# Patient Record
Sex: Female | Born: 1960 | Race: White | Hispanic: No | Smoking: Never smoker
Health system: Southern US, Community
[De-identification: ages and names within clinical notes are randomized; demographics above are authoritative.]

## PROBLEM LIST (undated history)

## (undated) DIAGNOSIS — T8859XA Other complications of anesthesia, initial encounter: Secondary | ICD-10-CM

## (undated) DIAGNOSIS — K8020 Calculus of gallbladder without cholecystitis without obstruction: Secondary | ICD-10-CM

## (undated) DIAGNOSIS — IMO0002 Reserved for concepts with insufficient information to code with codable children: Secondary | ICD-10-CM

## (undated) DIAGNOSIS — F419 Anxiety disorder, unspecified: Secondary | ICD-10-CM

## (undated) DIAGNOSIS — Z87442 Personal history of urinary calculi: Secondary | ICD-10-CM

## (undated) DIAGNOSIS — J309 Allergic rhinitis, unspecified: Secondary | ICD-10-CM

## (undated) DIAGNOSIS — M329 Systemic lupus erythematosus, unspecified: Secondary | ICD-10-CM

## (undated) DIAGNOSIS — R519 Headache, unspecified: Secondary | ICD-10-CM

## (undated) DIAGNOSIS — F32A Depression, unspecified: Secondary | ICD-10-CM

## (undated) DIAGNOSIS — R002 Palpitations: Secondary | ICD-10-CM

## (undated) DIAGNOSIS — Z973 Presence of spectacles and contact lenses: Secondary | ICD-10-CM

## (undated) DIAGNOSIS — E079 Disorder of thyroid, unspecified: Secondary | ICD-10-CM

## (undated) DIAGNOSIS — J45909 Unspecified asthma, uncomplicated: Secondary | ICD-10-CM

## (undated) DIAGNOSIS — R51 Headache: Secondary | ICD-10-CM

## (undated) DIAGNOSIS — G8929 Other chronic pain: Secondary | ICD-10-CM

## (undated) DIAGNOSIS — K635 Polyp of colon: Secondary | ICD-10-CM

## (undated) DIAGNOSIS — N393 Stress incontinence (female) (male): Secondary | ICD-10-CM

## (undated) DIAGNOSIS — G4730 Sleep apnea, unspecified: Secondary | ICD-10-CM

## (undated) DIAGNOSIS — K589 Irritable bowel syndrome without diarrhea: Secondary | ICD-10-CM

## (undated) DIAGNOSIS — E039 Hypothyroidism, unspecified: Secondary | ICD-10-CM

## (undated) DIAGNOSIS — M797 Fibromyalgia: Secondary | ICD-10-CM

## (undated) DIAGNOSIS — F329 Major depressive disorder, single episode, unspecified: Secondary | ICD-10-CM

## (undated) DIAGNOSIS — M722 Plantar fascial fibromatosis: Secondary | ICD-10-CM

## (undated) DIAGNOSIS — E785 Hyperlipidemia, unspecified: Secondary | ICD-10-CM

## (undated) DIAGNOSIS — B977 Papillomavirus as the cause of diseases classified elsewhere: Secondary | ICD-10-CM

## (undated) HISTORY — DX: Irritable bowel syndrome, unspecified: K58.9

## (undated) HISTORY — DX: Polyp of colon: K63.5

## (undated) HISTORY — DX: Major depressive disorder, single episode, unspecified: F32.9

## (undated) HISTORY — DX: Depression, unspecified: F32.A

## (undated) HISTORY — DX: Other chronic pain: G89.29

## (undated) HISTORY — DX: Sleep apnea, unspecified: G47.30

## (undated) HISTORY — DX: Allergic rhinitis, unspecified: J30.9

## (undated) HISTORY — PX: MYRINGOTOMY: SHX2060

## (undated) HISTORY — DX: Systemic lupus erythematosus, unspecified: M32.9

## (undated) HISTORY — PX: LAPAROSCOPIC ENDOMETRIOSIS FULGURATION: SUR769

## (undated) HISTORY — DX: Reserved for concepts with insufficient information to code with codable children: IMO0002

## (undated) HISTORY — DX: Headache: R51

## (undated) HISTORY — DX: Disorder of thyroid, unspecified: E07.9

## (undated) HISTORY — DX: Fibromyalgia: M79.7

## (undated) HISTORY — DX: Headache, unspecified: R51.9

## (undated) HISTORY — DX: Hyperlipidemia, unspecified: E78.5

## (undated) HISTORY — DX: Calculus of gallbladder without cholecystitis without obstruction: K80.20

## (undated) HISTORY — PX: OTHER SURGICAL HISTORY: SHX169

## (undated) HISTORY — DX: Unspecified asthma, uncomplicated: J45.909

---

## 1997-04-16 HISTORY — PX: PARTIAL HYSTERECTOMY: SHX80

## 1997-04-16 HISTORY — PX: LAPAROSCOPIC TOTAL HYSTERECTOMY: SUR800

## 1999-09-27 ENCOUNTER — Other Ambulatory Visit: Admission: RE | Admit: 1999-09-27 | Discharge: 1999-09-27 | Payer: Self-pay | Admitting: *Deleted

## 1999-10-04 ENCOUNTER — Encounter: Admission: RE | Admit: 1999-10-04 | Discharge: 1999-10-04 | Payer: Self-pay | Admitting: Internal Medicine

## 1999-10-04 ENCOUNTER — Encounter: Payer: Self-pay | Admitting: Internal Medicine

## 2000-09-18 ENCOUNTER — Encounter: Payer: Self-pay | Admitting: Internal Medicine

## 2000-09-18 ENCOUNTER — Encounter: Admission: RE | Admit: 2000-09-18 | Discharge: 2000-09-18 | Payer: Self-pay | Admitting: Internal Medicine

## 2000-09-26 ENCOUNTER — Other Ambulatory Visit: Admission: RE | Admit: 2000-09-26 | Discharge: 2000-09-26 | Payer: Self-pay | Admitting: *Deleted

## 2001-04-10 ENCOUNTER — Ambulatory Visit (HOSPITAL_COMMUNITY): Admission: RE | Admit: 2001-04-10 | Discharge: 2001-04-10 | Payer: Self-pay | Admitting: Internal Medicine

## 2003-06-07 ENCOUNTER — Encounter: Admission: RE | Admit: 2003-06-07 | Discharge: 2003-06-07 | Payer: Self-pay

## 2004-03-16 ENCOUNTER — Ambulatory Visit (HOSPITAL_COMMUNITY): Admission: RE | Admit: 2004-03-16 | Discharge: 2004-03-16 | Payer: Self-pay | Admitting: *Deleted

## 2004-03-21 ENCOUNTER — Other Ambulatory Visit: Admission: RE | Admit: 2004-03-21 | Discharge: 2004-03-21 | Payer: Self-pay | Admitting: *Deleted

## 2004-10-11 ENCOUNTER — Encounter: Admission: RE | Admit: 2004-10-11 | Discharge: 2004-10-11 | Payer: Self-pay | Admitting: *Deleted

## 2004-11-20 ENCOUNTER — Ambulatory Visit (HOSPITAL_COMMUNITY): Admission: RE | Admit: 2004-11-20 | Discharge: 2004-11-20 | Payer: Self-pay | Admitting: *Deleted

## 2004-11-20 ENCOUNTER — Encounter (INDEPENDENT_AMBULATORY_CARE_PROVIDER_SITE_OTHER): Payer: Self-pay | Admitting: *Deleted

## 2005-05-14 ENCOUNTER — Ambulatory Visit (HOSPITAL_COMMUNITY): Admission: RE | Admit: 2005-05-14 | Discharge: 2005-05-14 | Payer: Self-pay | Admitting: *Deleted

## 2005-05-29 ENCOUNTER — Other Ambulatory Visit: Admission: RE | Admit: 2005-05-29 | Discharge: 2005-05-29 | Payer: Self-pay | Admitting: *Deleted

## 2005-07-26 ENCOUNTER — Encounter: Admission: RE | Admit: 2005-07-26 | Discharge: 2005-07-26 | Payer: Self-pay | Admitting: Otolaryngology

## 2006-08-26 ENCOUNTER — Ambulatory Visit (HOSPITAL_COMMUNITY): Admission: RE | Admit: 2006-08-26 | Discharge: 2006-08-26 | Payer: Self-pay | Admitting: *Deleted

## 2006-08-30 ENCOUNTER — Encounter: Admission: RE | Admit: 2006-08-30 | Discharge: 2006-08-30 | Payer: Self-pay | Admitting: *Deleted

## 2006-09-02 ENCOUNTER — Other Ambulatory Visit: Admission: RE | Admit: 2006-09-02 | Discharge: 2006-09-02 | Payer: Self-pay | Admitting: *Deleted

## 2007-10-22 ENCOUNTER — Ambulatory Visit (HOSPITAL_COMMUNITY): Admission: RE | Admit: 2007-10-22 | Discharge: 2007-10-22 | Payer: Self-pay | Admitting: *Deleted

## 2008-03-10 ENCOUNTER — Ambulatory Visit (HOSPITAL_COMMUNITY): Admission: RE | Admit: 2008-03-10 | Discharge: 2008-03-10 | Payer: Self-pay | Admitting: Chiropractic Medicine

## 2008-05-04 ENCOUNTER — Ambulatory Visit: Payer: Self-pay | Admitting: Pulmonary Disease

## 2008-05-04 DIAGNOSIS — R059 Cough, unspecified: Secondary | ICD-10-CM | POA: Insufficient documentation

## 2008-05-04 DIAGNOSIS — R51 Headache: Secondary | ICD-10-CM | POA: Insufficient documentation

## 2008-05-04 DIAGNOSIS — J45909 Unspecified asthma, uncomplicated: Secondary | ICD-10-CM | POA: Insufficient documentation

## 2008-05-04 DIAGNOSIS — R519 Headache, unspecified: Secondary | ICD-10-CM | POA: Insufficient documentation

## 2008-05-04 DIAGNOSIS — R05 Cough: Secondary | ICD-10-CM

## 2008-05-04 DIAGNOSIS — IMO0001 Reserved for inherently not codable concepts without codable children: Secondary | ICD-10-CM | POA: Insufficient documentation

## 2008-05-04 DIAGNOSIS — J309 Allergic rhinitis, unspecified: Secondary | ICD-10-CM | POA: Insufficient documentation

## 2008-05-04 DIAGNOSIS — M329 Systemic lupus erythematosus, unspecified: Secondary | ICD-10-CM | POA: Insufficient documentation

## 2008-05-18 ENCOUNTER — Ambulatory Visit: Payer: Self-pay | Admitting: Pulmonary Disease

## 2008-05-19 ENCOUNTER — Ambulatory Visit: Payer: Self-pay | Admitting: Cardiology

## 2008-05-20 ENCOUNTER — Telehealth (INDEPENDENT_AMBULATORY_CARE_PROVIDER_SITE_OTHER): Payer: Self-pay | Admitting: *Deleted

## 2008-05-27 ENCOUNTER — Telehealth (INDEPENDENT_AMBULATORY_CARE_PROVIDER_SITE_OTHER): Payer: Self-pay | Admitting: *Deleted

## 2008-06-07 ENCOUNTER — Ambulatory Visit: Payer: Self-pay | Admitting: Internal Medicine

## 2008-06-07 LAB — CONVERTED CEMR LAB
Anti Nuclear Antibody(ANA): NEGATIVE
Basophils Absolute: 0.1 10*3/uL (ref 0.0–0.1)
Basophils Relative: 0.5 % (ref 0.0–3.0)
Eosinophils Absolute: 0.1 10*3/uL (ref 0.0–0.7)
Hemoglobin: 14.3 g/dL (ref 12.0–15.0)
Lymphocytes Relative: 32.8 % (ref 12.0–46.0)
MCHC: 34.8 g/dL (ref 30.0–36.0)
MCV: 86 fL (ref 78.0–100.0)
Monocytes Relative: 4.8 % (ref 3.0–12.0)
RDW: 11.6 % (ref 11.5–14.6)
WBC: 12.2 10*3/uL — ABNORMAL HIGH (ref 4.5–10.5)

## 2008-06-16 ENCOUNTER — Telehealth (INDEPENDENT_AMBULATORY_CARE_PROVIDER_SITE_OTHER): Payer: Self-pay | Admitting: *Deleted

## 2008-07-09 ENCOUNTER — Ambulatory Visit: Payer: Self-pay | Admitting: Internal Medicine

## 2008-07-09 DIAGNOSIS — K219 Gastro-esophageal reflux disease without esophagitis: Secondary | ICD-10-CM | POA: Insufficient documentation

## 2008-07-09 DIAGNOSIS — H6690 Otitis media, unspecified, unspecified ear: Secondary | ICD-10-CM | POA: Insufficient documentation

## 2009-02-10 ENCOUNTER — Other Ambulatory Visit: Admission: RE | Admit: 2009-02-10 | Discharge: 2009-02-10 | Payer: Self-pay | Admitting: Internal Medicine

## 2009-04-16 HISTORY — PX: CHOLECYSTECTOMY: SHX55

## 2009-12-07 ENCOUNTER — Encounter: Admission: RE | Admit: 2009-12-07 | Discharge: 2009-12-07 | Payer: Self-pay | Admitting: Internal Medicine

## 2010-01-03 ENCOUNTER — Observation Stay (HOSPITAL_COMMUNITY): Admission: RE | Admit: 2010-01-03 | Discharge: 2010-01-04 | Payer: Self-pay | Admitting: General Surgery

## 2010-01-03 ENCOUNTER — Encounter (INDEPENDENT_AMBULATORY_CARE_PROVIDER_SITE_OTHER): Payer: Self-pay | Admitting: General Surgery

## 2010-03-19 ENCOUNTER — Emergency Department (HOSPITAL_COMMUNITY)
Admission: EM | Admit: 2010-03-19 | Discharge: 2010-03-19 | Payer: Self-pay | Source: Home / Self Care | Admitting: Emergency Medicine

## 2010-06-29 LAB — DIFFERENTIAL
Eosinophils Absolute: 0.1 10*3/uL (ref 0.0–0.7)
Eosinophils Relative: 1 % (ref 0–5)
Lymphs Abs: 2.7 10*3/uL (ref 0.7–4.0)
Monocytes Relative: 6 % (ref 3–12)
Neutro Abs: 5 10*3/uL (ref 1.7–7.7)

## 2010-06-29 LAB — COMPREHENSIVE METABOLIC PANEL
AST: 20 U/L (ref 0–37)
BUN: 6 mg/dL (ref 6–23)
CO2: 30 mEq/L (ref 19–32)
Calcium: 9.3 mg/dL (ref 8.4–10.5)
GFR calc non Af Amer: >60 mL/min (ref >60)
Glucose, Bld: 75 mg/dL (ref 70–99)
Potassium: 3.4 mEq/L — ABNORMAL LOW (ref 3.5–5.1)

## 2010-06-29 LAB — CBC
HCT: 39.6 % (ref 36.0–46.0)
Hemoglobin: 13.9 g/dL (ref 12.0–15.0)
MCHC: 35 g/dL (ref 30.0–36.0)
MCV: 88.1 fL (ref 78.0–100.0)
RDW: 12.4 % (ref 11.5–15.5)

## 2010-06-29 LAB — URINALYSIS, ROUTINE W REFLEX MICROSCOPIC
Bilirubin Urine: NEGATIVE
Ketones, ur: NEGATIVE mg/dL
Nitrite: NEGATIVE
Specific Gravity, Urine: 1.01 (ref 1.005–1.030)
pH: 6.5 (ref 5.0–8.0)

## 2010-06-29 LAB — SURGICAL PCR SCREEN: MRSA, PCR: NEGATIVE

## 2010-09-01 NOTE — Op Note (Signed)
NAMEMarland Mathews  CARY, WILFORD NO.:  [id number]   MEDICAL RECORD NO.:  [id number]          PATIENT TYPE:  AMB   LOCATION:  ENDO                         FACILITY:  [facility]   PHYSICIAN:  Georgiana Spinner, M.D.    DATE OF BIRTH:  19-Nov-1960   DATE OF PROCEDURE:  11/20/2004  DATE OF DISCHARGE:                                 OPERATIVE REPORT   PROCEDURE:  Upper endoscopy with biopsy.   INDICATIONS:  GERD.   ANESTHESIA:  Demerol 20 mg, Versed 2 mg.   PROCEDURE:  With the patient mildly sedated in the left lateral decubitus  position, the Olympus videoscopic endoscope was inserted and passed under  direct vision through the esophagus, which appeared normal until we reached  the distal esophagus, and there was one area appeared to be compatible with  Barrett's esophagus, photographed, and biopsies of this area were taken.  We  then entered into the stomach.  The fundus, body, antrum, duodenal bulb,  second portion of duodenum all appeared normal.  From this point the  endoscope was slowly withdrawn taking circumferential views of duodenal  mucosa until the endoscope had been pulled back into the stomach, placed in  retroflexion to view the stomach from below.  The endoscope was then  straightened and withdrawn taking circumferential views of the remaining  gastric and esophageal mucosa as best we could.  The patient had refused a  topical anesthetic and started to gag at this point, so we withdrew the  endoscope and viewed the esophagus as best we could on pull-through.  The  patient's vital signs and pulse oximetry remained stable.  The patient  tolerated procedure well without apparent complication.   FINDINGS:  Question of Barrett's esophagus, biopsied.   Await biopsy report. The patient will call me for results and follow up with  me as an outpatient       GMO/MEDQ  D:  11/20/2004  T:  11/20/2004  Job:  [id number]

## 2010-09-01 NOTE — Op Note (Signed)
NAMEMarland Mathews  NOVELLA, ABRAHA NO.:  [id number]   MEDICAL RECORD NO.:  [id number]          PATIENT TYPE:  AMB   LOCATION:  ENDO                         FACILITY:  [facility]   PHYSICIAN:  Georgiana Spinner, M.D.    DATE OF BIRTH:  [birth year]   DATE OF PROCEDURE:  11/20/2004  DATE OF DISCHARGE:                                 OPERATIVE REPORT   PROCEDURE:  Colonoscopy.   INDICATIONS:  Colon polyp.   ANESTHESIA:  Demerol 80, Versed 8 mg, Benadryl 25 mg.   PROCEDURE:  With the patient mildly sedated in the left lateral decubitus  position, the Olympus videoscopic colonoscope was inserted into the rectum  and passed under direct vision with pressure applied.  We were able reach  the cecum as identified by ileocecal valve, base of cecum, both of which  were photographed.  In the photograph of the ileocecal valve about two folds  removed from the valve was a polyp approximately 8 mm in size.  This was  photographed and removed using snare cautery technique, setting of 20/200  blended current.  The tissue was grasped and suctioned into the scope.  From  this point the colonoscope was then slowly withdrawn taking circumferential  views of colonic mucosa, stopping next only in the rectum, which appeared  normal on direct and retroflexed view.  The endoscope was straightened and  withdrawn.  The patient's vital signs and pulse oximetry remained stable.  The patient tolerated procedure well without apparent complication.   FINDINGS:  Polyp of this ascending colon two folds removed from the cecum.   PLAN:  Await biopsy report. The patient will call me for results and follow  up with me as an outpatient.  Proceed to endoscopy as planned.       GMO/MEDQ  D:  11/20/2004  T:  11/20/2004  Job:  [id number]

## 2012-02-20 ENCOUNTER — Ambulatory Visit (INDEPENDENT_AMBULATORY_CARE_PROVIDER_SITE_OTHER): Payer: BC Managed Care – PPO | Admitting: Sports Medicine

## 2012-02-20 ENCOUNTER — Encounter: Payer: Self-pay | Admitting: Sports Medicine

## 2012-02-20 VITALS — BP 117/78 | HR 87 | Ht 64.0 in | Wt 162.0 lb

## 2012-02-20 DIAGNOSIS — M7730 Calcaneal spur, unspecified foot: Secondary | ICD-10-CM

## 2012-02-20 DIAGNOSIS — M722 Plantar fascial fibromatosis: Secondary | ICD-10-CM | POA: Insufficient documentation

## 2012-02-20 NOTE — Patient Instructions (Signed)
Very nice to meet you I want you to wear the insoles and see if they help. Wear the arch supports Do the stretches daily and the icing after work at least if not three times a day.  Come back in 4-6 weeks.

## 2012-02-20 NOTE — Progress Notes (Signed)
Patient referred courtesy of Dr Merri Brunette  History of present illness: Patient is a very pleasant 52 year old female coming in with left heel pain. Patient states that it seems to have started approximately one year ago. Patient states that it has worsened significantly in the last 6 weeks after she started favoring the side secondary to her toe injury. Patient said that the pain is mostly on the medial aspect of the left heel but does sometime radiates to the lateral foot. Patient states that the pain seems to be worse as the day goes on and while she is standing on it. Patient states that this pain can radiate to the front ankle. Patient denies any numbness or weakness in the ankle or extremity. Patient denies any type of swelling. Patient has tried to treat this as a plantar fasciitis, she has done different insoles and her shoes and actually has a pair in right now that seems to be beneficial. Patient also has a gone to different integrative therapies to try to help them they have focus more on her low back. Patient though denies that she has any radiation of pain down her leg. Patient does not remember any true injury to this area. Patient has also tried some ice which has been minimally beneficial. Denies worsening pain in the morning and actually feels that that is when it's the best. Patient does state that she has gained some weight recently as well. Patient denies any fever, chills, any discoloration.  No past medical history on file. See problem list for past medical history but of note patient does have fibromyalgia  No past surgical history on file.  Current Outpatient Prescriptions on File Prior to Visit  Medication Sig Dispense Refill  . diphenhydrAMINE (SOMINEX) 25 MG tablet Take 25 mg by mouth as needed.      . ranitidine (ZANTAC) 150 MG tablet Take 150 mg by mouth 2 (two) times daily as needed.      Marland Kitchen imipramine (TOFRANIL) 25 MG tablet Take 25 mg by mouth At bedtime.      .  sertraline (ZOLOFT) 100 MG tablet Take 100 mg by mouth daily.      Marland Kitchen SYNTHROID 125 MCG tablet Take 125 mcg by mouth daily.       Allergies  Allergen Reactions  . Sulfonamide Derivatives Anaphylaxis    Delayed anaphylaxis   . Amoxicillin   . Ciprofloxacin   . Clarithromycin     Serum sickness syndrome  . Doxycycline     REACTION: unknown  . Erythromycin   . Indomethacin   . Iodine   . Lidocaine     Headaches and nausea  . Penicillins   . Zithromax (Azithromycin)     Serum sickness syndrome    Physical exam Blood pressure 117/78, pulse 87, height 5\' 4"  (1.626 m), weight 162 lb (73.483 kg). General: No apparent distress alert and oriented x3 mood and affect normal Lungs: Patient's be simple sentences and does not appear short of breath Skin: Patient skin is warm dry and intact no signs of infection or rash Neuro: Cranial nerves II through XII are intact, deep tendon reflexes are intact and 2+ and symmetric, or Vaseline tach in all extremities. Left foot exam: On inspection with patient no weightbearing there is no gross abnormalities. Patient is tender to palpation over the medial heel as well as on the plantar aspect of the heel. It does seem to be hardness of potential atrophy of the fat pad. Patient has no tenderness  over the Achilles and has full range of motion of the ankle. Patient is neurovascularly intact distally with a mild hallux rigidus. With standing patient does have pes keep this but does though have a breakdown of the longitudinal and transverse arch that does have sprain of the first and second toes. Otherwise there is no bunion formation. Patient's right ankle though does have a varus deformity of the hindfoot.   Ultrasound was done and interpreted by me today. Patient left plantar fascia was imaged and patient does have significant hypoechoic changes at the insertion. Patient also has what appears to be significant calcification and likely is a heel spur. Patient has  what could be likely a Proximal tear in the plantar fasciitis 1 cm distal to calcaneal insertion but fibers continue to the heel so this seems like a partial tear.  Thickness is 0.6 or ~ 2x that of RT PF in thickness. No significant neovascularization seen.

## 2012-02-20 NOTE — Assessment & Plan Note (Addendum)
Patient has what appears to be mostly a heel spur on ultrasound. Patient's history to of worsening pain with activity is not consistent with her regular plantar fasciitis. I do think though patient does have some plantar fasciitis that is contributing. At this time patient was given heel lifts to try to cushioned the heel. Patient was not given any sports insoles because she had just bought the insoles that she has. Patient given stretching exercises, icing protocol for plantar fasciitis, patient was also given arch strap. Patient will return in 4-6 weeks for further evaluation. At that time I would reimage with ultrasound her heel to make sure that there is some improvement. We may consider doing Percutaneus tenotomy in the area to breakdown and calcifications if she did not notice any significant improvement at followup.

## 2012-07-03 ENCOUNTER — Encounter: Payer: Self-pay | Admitting: Gastroenterology

## 2012-07-15 ENCOUNTER — Ambulatory Visit
Admission: RE | Admit: 2012-07-15 | Discharge: 2012-07-15 | Disposition: A | Discharge status: Home / Self Care | Payer: Federal, State, Local not specified - PPO | Source: Ambulatory Visit | Attending: Otolaryngology | Admitting: Otolaryngology

## 2012-07-15 ENCOUNTER — Other Ambulatory Visit: Payer: Self-pay | Admitting: Otolaryngology

## 2012-07-15 DIAGNOSIS — H7011 Chronic mastoiditis, right ear: Secondary | ICD-10-CM

## 2012-08-04 ENCOUNTER — Encounter: Payer: Self-pay | Admitting: *Deleted

## 2012-08-04 ENCOUNTER — Ambulatory Visit (INDEPENDENT_AMBULATORY_CARE_PROVIDER_SITE_OTHER): Payer: Federal, State, Local not specified - PPO | Admitting: Cardiovascular Disease

## 2012-08-04 VITALS — BP 116/76 | HR 99 | Ht 64.0 in | Wt 158.8 lb

## 2012-08-04 DIAGNOSIS — R002 Palpitations: Secondary | ICD-10-CM

## 2012-08-04 DIAGNOSIS — R0609 Other forms of dyspnea: Secondary | ICD-10-CM

## 2012-08-04 DIAGNOSIS — R0602 Shortness of breath: Secondary | ICD-10-CM

## 2012-08-04 DIAGNOSIS — R0600 Dyspnea, unspecified: Secondary | ICD-10-CM | POA: Insufficient documentation

## 2012-08-04 DIAGNOSIS — R0989 Other specified symptoms and signs involving the circulatory and respiratory systems: Secondary | ICD-10-CM

## 2012-08-04 DIAGNOSIS — E785 Hyperlipidemia, unspecified: Secondary | ICD-10-CM

## 2012-08-04 MED ORDER — ATORVASTATIN CALCIUM 40 MG PO TABS: 40.0000 mg | ORAL_TABLET | Freq: Every day | ORAL | Status: DC

## 2012-08-04 NOTE — Assessment & Plan Note (Signed)
Ashley Mathews presents with significant shortness breath with exertion. She used to walk 3 miles a day-3 times a week. She's had an injury to her foot and now has a heel spur. She has not been able to exercise and has gained weight since that time. She has noticed some shortness of breath with exertion over the past year.  I would like to get a stress echocardiogram for further evaluation of her left ventricular function and exercise capacity. She thinks that she will be up to walk on the treadmill with her heel spurs.  She is a very strong family history of cardiac disease and strokes. Her cholesterol is 250.  She has resisted taking statins in the past because she has lots of autoimmune problems. After much discussion. She does agree to take atorvastatin 40 mg a day. We'll see her back in 3 months for a followup office visit and fasting labs.  We will have her exercise as much as possible. We will give her information on the [facility] low glycemic index diet.

## 2012-08-04 NOTE — Patient Instructions (Addendum)
Your physician recommends that you schedule a follow-up appointment in: 3 months come fasting, drink water   Your physician has recommended you make the following change in your medication:   Start atorvastatin 40 mg take in evening after dinner.   Your physician has requested that you have a stress echocardiogram.  Please follow instruction sheet as given.  Your physician recommends that you return for a FASTING lipid profile: 3 months     [facility] Low Glycemic Diet (Source: [facility], 2006) Low Glycemic Foods (20-49) (Decrease risk of developing heart disease) Breakfast Cereals: All-Bran All-Bran Fruit 'n Oats Fiber One Oatmeal (not instant) Oat bran Fruits and fruit juices: (Limit to 1-2 servings per day) Apples Apricots (fresh & dried) Blackberries Blueberries Cherries Cranberries Peaches Pears Plums Prunes Grapefruit Raspberries Strawberries Tangerine Apple juice Grapefruit juice Tomato juice Beans and legumes (fresh-cooked): Black-eyed peas Butter beans Chick peas Lentils  Green beans Lima beans Kidney beans Navy beans Pinto beans Snow peas Non-starchy vegetables: Asparagus, avocado, broccoli, cabbage, cauliflower, celery, cucumber, greens, lettuce, mushrooms, peppers, tomatoes, okra, onions, spinach, summer squash Grains: Barley Bulgur Rye Wild rice Nuts and oils : Almonds Peanuts Sunflower seeds Hazelnuts Pecans Walnuts Oils that are liquid at room temperature Dairy, fish, meat, soy, and eggs: Milk, skim Lowfat cheese Yogurt, lowfat, fruit sugar sweetened Lean red meat Fish  Skinless chicken & [place] Shellfish Egg whites (up to 3 daily) Soy products  Egg yolks (up to 7 or _____ per week) Moderate Glycemic Foods (50-69) Breakfast Cereals: Bran Buds Bran Chex Just Right Mini-Wheats  Special K Swiss muesli Fruits: Banana (under-ripe) Dates Figs Grapes Kiwi Mango Oranges Raisins Fruit Juices: Cranberry juice Orange  juice Beans and legumes: [place]-type baked beans Canned pinto, kidney, or navy beans Green peas Vegetables: Beets Carrots  Sweet potato Yam Corn on the cob Breads: Pita (pocket) bread Oat bran bread Pumpernickel bread Rye bread Wheat bread, high fiber  Grains: Cornmeal Rice, brown Rice, white Couscous Pasta: Macaroni Pizza, cheese Ravioli, meat filled Spaghetti, white  Nuts: Cashews Macadamia Snacks: Chocolate Ice cream, lowfat Muffin Popcorn High Glycemic Foods (70-100)  Breakfast Cereals: Cheerios Corn Chex Corn Flakes Cream of Wheat Grape Nuts Grape Nut Flakes Grits Nutri-Grain Puffed Rice Puffed Wheat Rice Chex Rice Krispies Shredded Wheat Team Total Fruits: Pineapple Watermelon Banana (over-ripe) Beverages: Sodas, sweet tea, pineapple juice Vegetables: Potato, baked, boiled, fried, mashed [place] fries Canned or frozen corn Parsnips Winter squash Breads: Most breads (white and whole grain) Bagels Bread sticks Bread stuffing Kaiser roll Dinner rolls Grains: Rice, instant Tapioca, with milk Candy and most cookies Snacks: Donuts Corn chips Jelly beans Pretzels Pastries

## 2012-08-04 NOTE — Progress Notes (Signed)
Ashley Mathews Date of Birth  [birth year]       [facility]    [place] [address], [address]  [address], [address] [place], [place]  [address]   [place], [place]  [address] [phone]     [phone]   Fax  [phone]    Fax [phone]  Problem List: 1. Palpitations 2. Dyspnea with exertion 3. Hyperlipidemia 4. Autoimmune issues.  History of Present Illness:  Ashley Mathews is a 52 yo who presents with complaints of DOE and some palpitations.  She gets some exercise - not as much as she would like.  Heel spurs keep her from walking much.  Occasionally does the elliptical.    She used to walk 3 miles 3 x a week last year.  Since her foot injury, she has gained some weight and has not been exercising much.    Denies any chest pain.    She describes occasional episodes of palpitations - last only a few seconds.  Typically last 1-2 seconds.   She has a strong family history of CAD and strokes.   Her cholesterol is 250.     Current Outpatient Prescriptions on File Prior to Visit  Medication Sig Dispense Refill  . diphenhydrAMINE (SOMINEX) 25 MG tablet Take 25 mg by mouth as needed.      Marland Kitchen imipramine (TOFRANIL) 25 MG tablet Take 25 mg by mouth At bedtime.      . ranitidine (ZANTAC) 150 MG tablet Take 150 mg by mouth 2 (two) times daily as needed.      Marland Kitchen SYNTHROID 125 MCG tablet Take 100 mcg by mouth daily.        No current facility-administered medications on file prior to visit.    Allergies  Allergen Reactions  . Sulfonamide Derivatives Anaphylaxis    Delayed anaphylaxis   . Amoxicillin   . Ciprofloxacin   . Clarithromycin     Serum sickness syndrome  . Doxycycline     REACTION: unknown  . Erythromycin   . Indomethacin   . Iodine   . Lidocaine     Headaches and nausea  . Penicillins   . Zithromax (Azithromycin)     Serum sickness syndrome    Past Medical History  Diagnosis Date  . Thyroid disorder   . Allergic rhinitis   .  Asthma   . Headache, chronic daily   . Lupus   . Fibromyalgia     No past surgical history on file.  History  Smoking status  . Never Smoker   Smokeless tobacco  . Never Used    History  Alcohol Use: Not on file    No family history on file.  Reviw of Systems:  Reviewed in the HPI.  All other systems are negative.  Physical Exam: Blood pressure 116/76, pulse 99, height 5\' 4"  (1.626 m), weight 158 lb 12.8 oz (72.031 kg), SpO2 97.00%. General: Well developed, well nourished, in no acute distress.  Head: Normocephalic, atraumatic, sclera non-icteric, mucus membranes are moist,   Neck: Supple. Carotids are 2 + without bruits. No JVD   Lungs: Clear   Heart: RR, normal S1, S2  Abdomen: Soft, non-tender, non-distended with normal bowel sounds.  Msk:  Strength and tone are normal   Extremities: No clubbing or cyanosis. No edema.  Distal pedal pulses are 2+ and equal    Neuro: CN II - XII intact.  Alert and oriented X 3.   Psych:  Normal  ECG: August 04, 2012:  NSR at 38.  Normal ECG  Assessment / Plan:

## 2012-08-14 ENCOUNTER — Ambulatory Visit (INDEPENDENT_AMBULATORY_CARE_PROVIDER_SITE_OTHER): Payer: Federal, State, Local not specified - PPO | Admitting: Sports Medicine

## 2012-08-14 VITALS — BP 110/76 | Ht 64.0 in | Wt 156.0 lb

## 2012-08-14 DIAGNOSIS — M7730 Calcaneal spur, unspecified foot: Secondary | ICD-10-CM

## 2012-08-14 DIAGNOSIS — M7732 Calcaneal spur, left foot: Secondary | ICD-10-CM

## 2012-08-14 NOTE — Assessment & Plan Note (Signed)
Associated with plantar fasciitis. Failed conservative measures Plantar fascia injection today Continue stretching ice and strengthening Followup in 6 weeks

## 2012-08-14 NOTE — Patient Instructions (Addendum)
Thank you for coming in today. Take it easy for about 4-5 days.  Then restart your exercises including the stretching, massage and exercises.  Return in about 6 weeks.  Call or go to the ER if you develop a large red swollen joint with extreme pain or oozing puss.

## 2012-08-14 NOTE — Progress Notes (Signed)
Ashley Mathews is a 52 y.o. female who presents to [facility] today for followup left plantar fasciitis. Patient was seen last in November 2013 for left foot plantar fasciitis with heel spur.  This was seen on musculoskeletal ultrasound associated with a hypoechoic gap in the plantar fascia consistent with tearing.  She was treated with conservative measures including eccentric heel left, ice massage, shoe supports.  She notes very marginal improvement in the last 6 months. Her pain is quite significant and now interfering with her ability to walk at work and recreationally. She's here for repeat evaluation. She denies any radiating pain weakness numbness fevers or chill   PMH reviewed. Asthma History  Substance Use Topics  . Smoking status: Never Smoker   . Smokeless tobacco: Never Used  . Alcohol Use: Not on file   ROS as above otherwise neg   Exam:  BP 110/76  Ht 5\' 4"  (1.626 m)  Wt 156 lb (70.761 kg)  BMI 26.76 kg/m2 Gen: Well NAD MSK: Left foot normal-appearing tender over the medial aspect of the plantar calcaneus. Additionally tender over the os calcis.  Pulses capillary refill sensation are intact distally  Limited musculoskeletal ultrasound of the left foot Plantar fascia visualized. Measuring 0.6 cm in thickness. There is a hypoechoic gap seen within the medial aspect of the plantar fascia Additionally there is a irregular surface of the os calcis consistent with spurring  Ultrasound guided injection of the plantar fascia left foot Consent obtained and timeout performed. Patient notes tolerance to Marcaine.  The plantar fascia was visualized under much skeletal ultrasound and probe position was noted. The foot was then sterilized as was the ultrasound probe. Sterile gel was used in the plantar fascia was again visualized. The probe was removed  briefly and cold spray was applied.  A 25-gauge 1/2 inch needle was inserted and a probe was replaced on the skin.  A small amount of  fluid was injected and seen to infiltrate the hypoechoic structure in the plantar fascia.  10 mg of Kenalog and 1/2 mL of Marcaine were injected.  Patient tolerated the procedure well.

## 2012-08-22 ENCOUNTER — Ambulatory Visit (HOSPITAL_COMMUNITY): Payer: Federal, State, Local not specified - PPO | Attending: Cardiology | Admitting: Radiology

## 2012-08-22 ENCOUNTER — Encounter: Payer: Self-pay | Admitting: Cardiovascular Disease

## 2012-08-22 ENCOUNTER — Ambulatory Visit (HOSPITAL_BASED_OUTPATIENT_CLINIC_OR_DEPARTMENT_OTHER): Payer: Federal, State, Local not specified - PPO

## 2012-08-22 DIAGNOSIS — R0602 Shortness of breath: Secondary | ICD-10-CM | POA: Insufficient documentation

## 2012-08-22 DIAGNOSIS — R002 Palpitations: Secondary | ICD-10-CM

## 2012-08-22 DIAGNOSIS — Z8249 Family history of ischemic heart disease and other diseases of the circulatory system: Secondary | ICD-10-CM | POA: Insufficient documentation

## 2012-08-22 DIAGNOSIS — R072 Precordial pain: Secondary | ICD-10-CM

## 2012-08-22 DIAGNOSIS — J45909 Unspecified asthma, uncomplicated: Secondary | ICD-10-CM | POA: Insufficient documentation

## 2012-08-22 DIAGNOSIS — E785 Hyperlipidemia, unspecified: Secondary | ICD-10-CM | POA: Insufficient documentation

## 2012-08-22 DIAGNOSIS — R0989 Other specified symptoms and signs involving the circulatory and respiratory systems: Secondary | ICD-10-CM

## 2012-08-22 NOTE — Progress Notes (Signed)
Stress Echocardiogram performed.  

## 2012-08-27 ENCOUNTER — Telehealth: Payer: Self-pay | Admitting: Cardiovascular Disease

## 2012-08-27 NOTE — Telephone Encounter (Signed)
Follow Up ° ° ° ° °Pt calling in following up on test results. Please call. °

## 2012-08-27 NOTE — Telephone Encounter (Signed)
Results reviewed for echo stress test

## 2012-09-25 ENCOUNTER — Ambulatory Visit (INDEPENDENT_AMBULATORY_CARE_PROVIDER_SITE_OTHER): Payer: Federal, State, Local not specified - PPO | Admitting: Sports Medicine

## 2012-09-25 ENCOUNTER — Encounter: Payer: Self-pay | Admitting: Sports Medicine

## 2012-09-25 VITALS — BP 129/78 | HR 84 | Ht 64.0 in | Wt 156.0 lb

## 2012-09-25 DIAGNOSIS — M722 Plantar fascial fibromatosis: Secondary | ICD-10-CM

## 2012-09-25 MED ORDER — HYDROCODONE-ACETAMINOPHEN 5-325 MG PO TABS: 1.0000 | ORAL_TABLET | Freq: Three times a day (TID) | ORAL | Status: DC | PRN

## 2012-09-25 NOTE — Assessment & Plan Note (Signed)
She does have a small heel spur I am not sure that this is clearly the cause of her pain  Based on the improvement in her ultrasound scan I suggested we go ahead with custom orthotics rather than a surgical referral  If she does not get enough improvement we could also consider a more aggressive needling of the insertion of the plantar fascia  She will be brought back for orthotics next week

## 2012-09-25 NOTE — Patient Instructions (Addendum)
Thank you for coming in today. I think it is time for you to talk to a foot surgeon. We can make you an appointment.  Please follow up as needed.

## 2012-09-25 NOTE — Progress Notes (Signed)
Patient ID: Ashley Mathews, female   DOB: 09-Sep-1960, 52 y.o.   MRN: [id number]  Patient returns for a heel spur and plantar fasciitis She only got temporary relief just for a few weeks from her injection The pain really limits her to push doesn't want to walk normally on her foot She tends to walk on the outside of her foot when she becomes tired She does have a history of some chronic pain issues with fibromyalgia She has done the stretches and exercises but does not feel these have made a lot of change in her pain pattern  Physical examination Alert oriented x3 in in no acute distress Foot examination reveals tenderness that is more over the os calcis and the medial border of the plantar fascia Walking gait reveals that she walks with a slight antalgic limp  Ultrasound examination Compared to last visit the actual tear in the plantar fashion is about 50% smaller since the time of her injection There is less calcification and she has only a very small amount of spurring Thickness at the worst area is only 0.58

## 2012-09-30 ENCOUNTER — Ambulatory Visit (INDEPENDENT_AMBULATORY_CARE_PROVIDER_SITE_OTHER): Payer: Federal, State, Local not specified - PPO | Admitting: Sports Medicine

## 2012-09-30 VITALS — BP 123/78 | Ht 64.0 in | Wt 155.0 lb

## 2012-09-30 DIAGNOSIS — M722 Plantar fascial fibromatosis: Secondary | ICD-10-CM

## 2012-09-30 NOTE — Progress Notes (Signed)
Patient ID: Ashley Mathews, female   DOB: 1961/04/11, 52 y.o.   MRN: [id number]  Patient returns for evaluation of her plantar fasciitis. When she was seen on last visit she still had significant pain and so was even considering surgery. However when we did her ultrasound scan the plantar fascia looked improved. The calcification and almost resolved. The amount of swelling around the proximal plantar fascia was less.  She had been using a temporary orthotic that I thought was probably okay. However with her ongoing symptoms I felt we should make her a custom orthotic as I did not see enough evidence to recommend surgery.  She appear to get some benefit from injection about 8 weeks ago so we could repeat that in 4-6 weeks if needed  Physical examination  No acute distress She is pronated when standing but when seated has a high arch There is splaying between the first and second toes Still tender to palpation at the medial insertion of the left plantar fascia She walks in supplementation to take pressure off of the left medial heel

## 2012-09-30 NOTE — Assessment & Plan Note (Signed)
Patient was fitted for a : standard, cushioned, semi-rigid orthotic. The orthotic was heated and afterward the patient stood on the orthotic blank positioned on the orthotic stand. The patient was positioned in subtalar neutral position and 10 degrees of ankle dorsiflexion in a weight bearing stance. After completion of molding, a stable base was applied to the orthotic blank. The blank was ground to a stable position for weight bearing. Size: 8 RED EVA Base: blue medium density EVA Posting: none Additional orthotic padding: none  Time 45 mins for preparation and evaluation  After completion of the orthotic she can walk without a limp and felt that she had better support. We did place her in mild supination bilaterally. We will need to follow this up in one month but I want her to start gradually try to increase a little bit of walking.

## 2012-10-27 ENCOUNTER — Other Ambulatory Visit: Payer: Federal, State, Local not specified - PPO

## 2012-10-27 ENCOUNTER — Ambulatory Visit: Payer: Federal, State, Local not specified - PPO | Admitting: Cardiovascular Disease

## 2012-11-05 ENCOUNTER — Ambulatory Visit (INDEPENDENT_AMBULATORY_CARE_PROVIDER_SITE_OTHER): Payer: Federal, State, Local not specified - PPO | Admitting: Sports Medicine

## 2012-11-05 VITALS — BP 110/68 | Ht 64.0 in | Wt 145.0 lb

## 2012-11-05 DIAGNOSIS — M722 Plantar fascial fibromatosis: Secondary | ICD-10-CM

## 2012-11-05 NOTE — Assessment & Plan Note (Signed)
She is finally making excellent progress  Continue using the orthotics and continue on the home exercise and stretching  Recheck in about 3 months and scan at that time

## 2012-11-05 NOTE — Patient Instructions (Addendum)
Start easy ankle exercises Progress to doing 1 foot cone touches  Keep up calf raises Keep up stretches  Use orthotics or arch support in all shoes  Reck 3 mos

## 2012-11-05 NOTE — Progress Notes (Signed)
Patient ID: Ashley Mathews, female   DOB: [birth year], 52 y.o.   MRN: [id number]  Patient comes for followup of plantar fasciitis which she has had for 2 years  We made her custom orthotics She feels that this get rid of 60-75% of her pain She walks a great deal in her job For the first time she was able to walk for most of the job on hard surfaces without having to stop because of foot pain  She continues doing exercises and stretches She also bought sandals with arch support  Physical examination  No acute distress  There is minimal tenderness along the medial border of the calcaneus at the insertion of the plantar fascia She has excellent great toe motion No tenderness along the rest of the arch  Standing without the orthotic she shows longitudinal arch collapse Standing in the orthotic she corrects most of this

## 2012-11-11 ENCOUNTER — Ambulatory Visit (INDEPENDENT_AMBULATORY_CARE_PROVIDER_SITE_OTHER): Payer: Federal, State, Local not specified - PPO | Admitting: Physician Assistant

## 2012-11-11 ENCOUNTER — Encounter: Payer: Self-pay | Admitting: Physician Assistant

## 2012-11-11 ENCOUNTER — Other Ambulatory Visit: Payer: Federal, State, Local not specified - PPO

## 2012-11-11 VITALS — BP 110/81 | HR 84 | Ht 64.0 in | Wt 149.0 lb

## 2012-11-11 DIAGNOSIS — R002 Palpitations: Secondary | ICD-10-CM

## 2012-11-11 DIAGNOSIS — R0602 Shortness of breath: Secondary | ICD-10-CM

## 2012-11-11 DIAGNOSIS — E785 Hyperlipidemia, unspecified: Secondary | ICD-10-CM

## 2012-11-11 LAB — BASIC METABOLIC PANEL
BUN: 9 mg/dL (ref 6–23)
Chloride: 102 mEq/L (ref 96–112)
Glucose, Bld: 94 mg/dL (ref 70–99)
Potassium: 3.3 mEq/L — ABNORMAL LOW (ref 3.5–5.1)

## 2012-11-11 LAB — LIPID PANEL
Total CHOL/HDL Ratio: 4
VLDL: 41.6 mg/dL — ABNORMAL HIGH (ref 0.0–40.0)

## 2012-11-11 LAB — HEPATIC FUNCTION PANEL
Albumin: 4.3 g/dL (ref 3.5–5.2)
Alkaline Phosphatase: 60 U/L (ref 39–117)
Bilirubin, Direct: 0 mg/dL (ref 0.0–0.3)
Total Bilirubin: 0.5 mg/dL (ref 0.3–1.2)

## 2012-11-11 NOTE — Patient Instructions (Signed)
Your physician wants you to follow-up in:  6 months. You will receive a reminder letter in the mail two months in advance. If you don't receive a letter, please call our office to schedule the follow-up appointment.   

## 2012-11-11 NOTE — Addendum Note (Signed)
Addended by: Tonita Phoenix on: 11/11/2012 10:55 AM   Modules accepted: Orders

## 2012-11-11 NOTE — Progress Notes (Signed)
[address] [place], [place]  [address] Phone: [phone] Fax:  [phone]  Date:  11/11/2012   ID:  Ashley Mathews, DOB October 05, 1960, MRN [id number]  PCP:  Londell Moh, MD  Cardiologist:  Dr. Delane Ginger     History of Present Illness: Ashley Mathews is a 52 y.o. female who returns for followup.  She has a hx of depression, HL, fibromyalgia and a strong family history of CAD. She was evaluated by Dr. Elease Hashimoto 07/2012 for dyspnea with exertion and palpitations. ETT-echocardiogram was obtained 08/2014: EF 60%, normal study. Given her RFs with her family hx, Dr. Elease Hashimoto recommended starting atorvastatin for control of her lipids.  Unfortunately, she developed worsening allergy symptoms and headaches with the Lipitor. She began decreasing it to every other day, then 3 times a week with relief of her symptoms. She has been dieting and losing weight. She stopped taking Lipitor about 3 weeks ago. She denies chest pain, shortness of breath, syncope. She denies orthopnea, PND or edema. She continues to note occasional flutters. She does admit to being under significant amounts of stress.  Labs (9/11):  K 3.4, Cr 0.67, ALT 21, Hgb 13.9  Wt Readings from Last 3 Encounters:  11/11/12 149 lb (67.586 kg)  11/05/12 145 lb (65.772 kg)  09/30/12 155 lb (70.308 kg)     Past Medical History  Diagnosis Date  . Thyroid disorder   . Allergic rhinitis   . Asthma   . Headache, chronic daily   . Lupus   . Fibromyalgia     Current Outpatient Prescriptions  Medication Sig Dispense Refill  . atorvastatin (LIPITOR) 40 MG tablet Take 1 tablet (40 mg total) by mouth daily.  30 tablet  4  . diphenhydrAMINE (SOMINEX) 25 MG tablet Take 25 mg by mouth as needed.      Marland Kitchen HYDROcodone-acetaminophen (NORCO/VICODIN) 5-325 MG per tablet Take 1 tablet by mouth every 8 (eight) hours as needed for pain.  30 tablet  0  . imipramine (TOFRANIL) 25 MG tablet Take 25 mg by mouth daily. TAKE  THREE TABS AT BED-TIME      . levothyroxine (SYNTHROID, LEVOTHROID) 100 MCG tablet Take 100 mcg by mouth daily before breakfast.      . ranitidine (ZANTAC) 150 MG tablet Take 150 mg by mouth daily.        No current facility-administered medications for this visit.    Allergies:    Allergies  Allergen Reactions  . Sulfonamide Derivatives Anaphylaxis    Delayed anaphylaxis   . Amoxicillin   . Ciprofloxacin   . Clarithromycin     Serum sickness syndrome  . Doxycycline     REACTION: unknown  . Erythromycin   . Indomethacin   . Iodine   . Lidocaine     Headaches and nausea  . Penicillins   . Zithromax (Azithromycin)     Serum sickness syndrome    Social History:  The patient  reports that she has never smoked. She has never used smokeless tobacco.   ROS:  Please see the history of present illness.      All other systems reviewed and negative.   PHYSICAL EXAM: VS:  BP 110/81  Pulse 84  Ht 5\' 4"  (1.626 m)  Wt 149 lb (67.586 kg)  BMI 25.56 kg/m2 Well nourished, well developed, in no acute distress HEENT: normal Neck: no JVD Cardiac:  normal S1, S2; RRR; no murmur Lungs:  clear to auscultation bilaterally, no wheezing, rhonchi  or rales Abd: soft, nontender, no hepatomegaly Ext: no edema Skin: warm and dry Neuro:  CNs 2-12 intact, no focal abnormalities noted  EKG:  NSR, HR 84, normal axis, no acute changes     ASSESSMENT AND PLAN:  1. Palpitations:  Symptoms sound c/w with PAC vs PVCs.  She is under a tremendous amount of stress and is somewhat tearful during the exam.  She does not drink that much caffeine.  We discussed limiting this as much as possible.  If her symptoms worsen or increase, will have her wear an event monitor x 21 days. 2. Hyperlipidemia:  She would like to see how she does with diet alone.  Check Lipids and LFTs today.  3. Disposition:  F/u with Dr. Delane Ginger in 6 mos.   Signed, Tereso Newcomer, PA-C  11/11/2012 10:29 AM

## 2012-11-13 ENCOUNTER — Telehealth: Payer: Self-pay | Admitting: *Deleted

## 2012-11-13 DIAGNOSIS — E876 Hypokalemia: Secondary | ICD-10-CM

## 2012-11-13 MED ORDER — POTASSIUM CHLORIDE CRYS ER 10 MEQ PO TBCR: 10.0000 meq | EXTENDED_RELEASE_TABLET | Freq: Every day | ORAL | Status: DC

## 2012-11-13 NOTE — Telephone Encounter (Signed)
Message copied by Antony Odea on Thu Nov 13, 2012  8:58 AM ------      Message from: Vesta Mixer      Created: Tue Nov 11, 2012  3:28 PM       Increase potassium intake.  Recheck in 1 month       ------

## 2012-11-13 NOTE — Telephone Encounter (Signed)
Pt to start k+ 10 meq daily/ bmet in 1 month app given. Pt aware.

## 2012-12-17 ENCOUNTER — Other Ambulatory Visit: Payer: Federal, State, Local not specified - PPO

## 2012-12-19 ENCOUNTER — Other Ambulatory Visit (INDEPENDENT_AMBULATORY_CARE_PROVIDER_SITE_OTHER): Payer: Federal, State, Local not specified - PPO

## 2012-12-19 DIAGNOSIS — E876 Hypokalemia: Secondary | ICD-10-CM

## 2012-12-19 LAB — BASIC METABOLIC PANEL
BUN: 14 mg/dL (ref 6–23)
CO2: 30 mEq/L (ref 19–32)
Chloride: 103 mEq/L (ref 96–112)
Glucose, Bld: 104 mg/dL — ABNORMAL HIGH (ref 70–99)
Potassium: 3.4 mEq/L — ABNORMAL LOW (ref 3.5–5.1)

## 2013-01-25 ENCOUNTER — Emergency Department (INDEPENDENT_AMBULATORY_CARE_PROVIDER_SITE_OTHER)
Admission: EM | Admit: 2013-01-25 | Discharge: 2013-01-25 | Disposition: A | Discharge status: Home / Self Care | Payer: Federal, State, Local not specified - PPO | Source: Home / Self Care | Attending: Emergency Medicine | Admitting: Emergency Medicine

## 2013-01-25 ENCOUNTER — Encounter (HOSPITAL_COMMUNITY): Payer: Self-pay | Admitting: Emergency Medicine

## 2013-01-25 DIAGNOSIS — R05 Cough: Secondary | ICD-10-CM

## 2013-01-25 DIAGNOSIS — R059 Cough, unspecified: Secondary | ICD-10-CM

## 2013-01-25 LAB — POCT RAPID STREP A: Streptococcus, Group A Screen (Direct): NEGATIVE

## 2013-01-25 MED ORDER — ONDANSETRON HCL 4 MG PO TABS: 4.0000 mg | ORAL_TABLET | Freq: Three times a day (TID) | ORAL | Status: DC | PRN

## 2013-01-25 MED ORDER — HYDROCOD POLST-CHLORPHEN POLST 10-8 MG/5ML PO LQCR: 5.0000 mL | Freq: Two times a day (BID) | ORAL | Status: DC | PRN

## 2013-01-25 NOTE — ED Provider Notes (Signed)
Medical screening examination/treatment/procedure(s) were performed by a resident physician and as supervising physician I was immediately available for consultation/collaboration.  Nevea Spiewak, M.D.  Evelise Reine C Chaeli Judy, MD 01/25/13 1848 

## 2013-01-25 NOTE — ED Provider Notes (Signed)
CSN: [id number]     Arrival date & time 01/25/13  1718 History   First MD Initiated Contact with Patient 01/25/13 1752     Chief Complaint  Patient presents with  . Cough  . Sore Throat   (Consider location/radiation/quality/duration/timing/severity/associated sxs/prior Treatment) HPI Patient is 52 yo F presenting with sore throat, ear ache and cough. She states she woke up at 3am on Friday morning (today is Sunday) with sore throat. She has been getting progressively worse. She states she does not have a recorded fever but does have "hot spells" without chills. No known sick contacts. She has been able to tolerate fluids, decreased appetite. She does report some nausea, worse with eating.   Past Medical History  Diagnosis Date  . Thyroid disorder   . Allergic rhinitis   . Asthma   . Headache, chronic daily   . Lupus   . Fibromyalgia    History reviewed. No pertinent past surgical history. History reviewed. No pertinent family history. History  Substance Use Topics  . Smoking status: Never Smoker   . Smokeless tobacco: Never Used  . Alcohol Use: Not on file   OB History   Grav Para Term Preterm Abortions TAB SAB Ect Mult Living                 Review of Systems  Constitutional: Positive for chills and diaphoresis. Negative for fever.  HENT: Positive for ear discharge, ear pain, postnasal drip and sore throat. Negative for congestion, rhinorrhea and trouble swallowing.   Eyes: Negative for visual disturbance.  Respiratory: Positive for cough. Negative for shortness of breath and wheezing.   Cardiovascular: Negative for chest pain.  Gastrointestinal: Positive for nausea. Negative for vomiting and abdominal pain.  Genitourinary: Negative for dysuria.  Musculoskeletal: Negative for arthralgias and myalgias.  Skin: Negative for rash.  Neurological: Negative for headaches.    Allergies  Sulfonamide derivatives; Amoxicillin; Ciprofloxacin; Clarithromycin; Doxycycline;  Erythromycin; Indomethacin; Iodine; Lidocaine; Penicillins; and Zithromax  Home Medications   Current Outpatient Rx  Name  Route  Sig  Dispense  Refill  . atorvastatin (LIPITOR) 40 MG tablet   Oral   Take 1 tablet (40 mg total) by mouth daily.   30 tablet   4   . chlorpheniramine-HYDROcodone (TUSSIONEX) 10-8 MG/5ML LQCR   Oral   Take 5 mLs by mouth every 12 (twelve) hours as needed.   115 mL   0   . diphenhydrAMINE (SOMINEX) 25 MG tablet   Oral   Take 25 mg by mouth as needed.         Marland Kitchen HYDROcodone-acetaminophen (NORCO/VICODIN) 5-325 MG per tablet   Oral   Take 1 tablet by mouth every 8 (eight) hours as needed for pain.   30 tablet   0   . imipramine (TOFRANIL) 25 MG tablet   Oral   Take 25 mg by mouth daily. TAKE THREE TABS AT BED-TIME         . levothyroxine (SYNTHROID, LEVOTHROID) 100 MCG tablet   Oral   Take 100 mcg by mouth daily before breakfast.         . ondansetron (ZOFRAN) 4 MG tablet   Oral   Take 1 tablet (4 mg total) by mouth every 8 (eight) hours as needed for nausea.   20 tablet   0   . potassium chloride (K-DUR,KLOR-CON) 10 MEQ tablet   Oral   Take 1 tablet (10 mEq total) by mouth daily.   30 tablet   11   .  ranitidine (ZANTAC) 150 MG tablet   Oral   Take 150 mg by mouth daily.           BP 136/85  Pulse 89  Temp(Src) 98.4 F (36.9 C) (Oral)  Resp 16  SpO2 96% Physical Exam  Constitutional: She is oriented to person, place, and time. She appears well-developed and well-nourished. No distress.  HENT:  Head: Normocephalic and atraumatic.  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  Left Ear: Tympanic membrane normal. Tympanic membrane is not erythematous.  Nose: Nose normal.  Mouth/Throat: Uvula is midline and mucous membranes are normal. Posterior oropharyngeal erythema present. No oropharyngeal exudate.  Eyes: Conjunctivae are normal. Pupils are equal, round, and reactive to light.  Neck: Normal  range of motion.  Cardiovascular: Normal rate, regular rhythm and normal heart sounds.   Pulmonary/Chest: Effort normal and breath sounds normal. She has no wheezes.  Abdominal: Soft. There is no tenderness.  Musculoskeletal: Normal range of motion. She exhibits no edema and no tenderness.  Lymphadenopathy:    She has no cervical adenopathy.  Neurological: She is alert and oriented to person, place, and time.  Skin: Skin is dry. No rash noted.    ED Course  Procedures (including critical care time) Labs Review Labs Reviewed  POCT RAPID STREP A ([facility]RG CARE ONLY)   Imaging Review No results found.  MDM   1. Cough    Cough most likely secondary to viral infection.  - Tussionex prn cough - Zofran prn nausea - Symptomatic care - F/u as needed, or if she fails to improve    Hilarie Fredrickson, MD 01/25/13 1840

## 2013-01-25 NOTE — ED Notes (Signed)
C/o sore throat and coughing since Thursday.  Patient states she has been having hot flashes.  OTC medications taken but no relief.

## 2013-01-27 LAB — CULTURE, GROUP A STREP

## 2013-03-30 ENCOUNTER — Other Ambulatory Visit: Payer: Self-pay

## 2013-03-30 DIAGNOSIS — Z1231 Encounter for screening mammogram for malignant neoplasm of breast: Secondary | ICD-10-CM

## 2013-05-01 ENCOUNTER — Ambulatory Visit
Admission: RE | Admit: 2013-05-01 | Discharge: 2013-05-01 | Disposition: A | Discharge status: Home / Self Care | Payer: Federal, State, Local not specified - PPO | Source: Ambulatory Visit

## 2013-05-01 DIAGNOSIS — Z1231 Encounter for screening mammogram for malignant neoplasm of breast: Secondary | ICD-10-CM

## 2013-05-13 ENCOUNTER — Encounter (INDEPENDENT_AMBULATORY_CARE_PROVIDER_SITE_OTHER): Payer: Self-pay

## 2013-05-13 ENCOUNTER — Encounter: Payer: Self-pay | Admitting: Cardiovascular Disease

## 2013-05-13 ENCOUNTER — Ambulatory Visit (INDEPENDENT_AMBULATORY_CARE_PROVIDER_SITE_OTHER): Payer: Federal, State, Local not specified - PPO | Admitting: Cardiovascular Disease

## 2013-05-13 VITALS — BP 110/68 | HR 68 | Ht 64.0 in | Wt 148.0 lb

## 2013-05-13 DIAGNOSIS — E7849 Other hyperlipidemia: Secondary | ICD-10-CM | POA: Insufficient documentation

## 2013-05-13 DIAGNOSIS — R002 Palpitations: Secondary | ICD-10-CM

## 2013-05-13 DIAGNOSIS — E785 Hyperlipidemia, unspecified: Secondary | ICD-10-CM

## 2013-05-13 LAB — BASIC METABOLIC PANEL
BUN: 12 mg/dL (ref 6–23)
CHLORIDE: 102 meq/L (ref 96–112)
CO2: 29 mEq/L (ref 19–32)
Calcium: 9.3 mg/dL (ref 8.4–10.5)
Creatinine, Ser: 0.8 mg/dL (ref 0.4–1.2)
GFR: 80.66 mL/min (ref >60.00)
Glucose, Bld: 78 mg/dL (ref 70–99)
POTASSIUM: 3.9 meq/L (ref 3.5–5.1)
Sodium: 137 mEq/L (ref 135–145)

## 2013-05-13 LAB — LIPID PANEL
CHOL/HDL RATIO: 4
Cholesterol: 221 mg/dL — ABNORMAL HIGH (ref 0–200)
HDL: 54.3 mg/dL (ref >39.00)
TRIGLYCERIDES: 285 mg/dL — AB (ref 0.0–149.0)
VLDL: 57 mg/dL — AB (ref 0.0–40.0)

## 2013-05-13 LAB — LDL CHOLESTEROL, DIRECT: LDL DIRECT: 134.5 mg/dL

## 2013-05-13 LAB — HEPATIC FUNCTION PANEL
ALT: 26 U/L (ref 0–35)
AST: 17 U/L (ref 0–37)
Albumin: 3.9 g/dL (ref 3.5–5.2)
Alkaline Phosphatase: 82 U/L (ref 39–117)
Bilirubin, Direct: 0 mg/dL (ref 0.0–0.3)
TOTAL PROTEIN: 6.9 g/dL (ref 6.0–8.3)
Total Bilirubin: 0.4 mg/dL (ref 0.3–1.2)

## 2013-05-13 NOTE — Progress Notes (Signed)
Ashley Mathews Date of Birth  09/01/1960       [facility]    [facility] [facility] [address], [address]  [address], [address] [place], [place]  [address]   [place]  [id number] [phone]     [phone]   Fax  [phone]    Fax [phone]  Problem List: 1. Palpitations 2. Dyspnea with exertion 3. Hyperlipidemia 4. Autoimmune issues. 5. Hypothyroidism   History of Present Illness:  Ashley Mathews is a 53 yo who presents with complaints of DOE and some palpitations.  She gets some exercise - not as much as she would like.  Heel spurs keep her from walking much.  Occasionally does the elliptical.    She used to walk 3 miles 3 x a week last year.  Since her foot injury, she has gained some weight and has not been exercising much.    Denies any chest pain.    She describes occasional episodes of palpitations - last only a few seconds.  Typically last 1-2 seconds.   She has a strong family history of CAD and strokes.   Her cholesterol is 250.    05/13/2013:  Ashley Mathews has been having some stress issues - going through a separation from her husband.   She is not exercising.  She does not take her lipitor on a regular basis.   She ha lost 16 lbs.    She has noticed that her PVCs increased when she skips her potassium.    She has occaionally CP and dypsnea - but only with anxiety.  These usually resolve with deep breaths and relaxing.   Current Outpatient Prescriptions on File Prior to Visit  Medication Sig Dispense Refill  . atorvastatin (LIPITOR) 40 MG tablet Take 1 tablet (40 mg total) by mouth daily.  30 tablet  4  . chlorpheniramine-HYDROcodone (TUSSIONEX) 10-8 MG/5ML LQCR Take 5 mLs by mouth every 12 (twelve) hours as needed.  115 mL  0  . diphenhydrAMINE (SOMINEX) 25 MG tablet Take 25 mg by mouth as needed.      Marland Kitchen. HYDROcodone-acetaminophen (NORCO/VICODIN) 5-325 MG per tablet Take 1 tablet by mouth every 8 (eight) hours as needed for pain.  30 tablet  0    . imipramine (TOFRANIL) 25 MG tablet Take 25 mg by mouth daily. TAKE THREE TABS AT BED-TIME      . levothyroxine (SYNTHROID, LEVOTHROID) 100 MCG tablet Take 100 mcg by mouth daily before breakfast.      . ondansetron (ZOFRAN) 4 MG tablet Take 1 tablet (4 mg total) by mouth every 8 (eight) hours as needed for nausea.  20 tablet  0  . potassium chloride (K-DUR,KLOR-CON) 10 MEQ tablet Take 1 tablet (10 mEq total) by mouth daily.  30 tablet  11  . ranitidine (ZANTAC) 150 MG tablet Take 150 mg by mouth daily.        No current facility-administered medications on file prior to visit.    Allergies  Allergen Reactions  . Sulfonamide Derivatives Anaphylaxis    Delayed anaphylaxis   . Amoxicillin   . Ciprofloxacin   . Clarithromycin     Serum sickness syndrome  . Doxycycline     REACTION: unknown  . Erythromycin   . Indomethacin   . Iodine   . Lidocaine     Headaches and nausea  . Penicillins   . Zithromax [Azithromycin]     Serum sickness syndrome    Past Medical History  Diagnosis  Date  . Thyroid disorder   . Allergic rhinitis   . Asthma   . Headache, chronic daily   . Lupus   . Fibromyalgia     No past surgical history on file.  History  Smoking status  . Never Smoker   Smokeless tobacco  . Never Used    History  Alcohol Use: Not on file    No family history on file.  Reviw of Systems:  Reviewed in the HPI.  All other systems are negative.  Physical Exam: Blood pressure 110/68, pulse 68, height 5\' 4"  (1.626 m), weight 148 lb (67.132 kg). General: Well developed, well nourished, in no acute distress.  Head: Normocephalic, atraumatic, sclera non-icteric, mucus membranes are moist,   Neck: Supple. Carotids are 2 + without bruits. No JVD   Lungs: Clear   Heart: RR, normal S1, S2  Abdomen: Soft, non-tender, non-distended with normal bowel sounds.  Msk:  Strength and tone are normal   Extremities: No clubbing or cyanosis. No edema.  Distal pedal pulses  are 2+ and equal    Neuro: CN II - XII intact.  Alert and oriented X 3.   Psych:  Normal   ECG:   Assessment / Plan:

## 2013-05-13 NOTE — Patient Instructions (Signed)
Your physician wants you to follow-up in:6 MONTHS  You will receive a reminder letter in the mail two months in advance. If you don't receive a letter, please call our office to schedule the follow-up appointment.  Your physician recommends that you return for a FASTING lipid profile: TODAY AND IN 6 MONTHS   Your physician recommends that you continue on your current medications as directed. Please refer to the Current Medication list given to you today.

## 2013-05-13 NOTE — Assessment & Plan Note (Signed)
Her lipid levels continued to be mildly elevated. She does not take her statin on a regular basis. She takes Lipitor perhaps once a week.  We'll check fasting lipids today.

## 2013-05-13 NOTE — Assessment & Plan Note (Signed)
She continues to have intermittent palpitations when she forgets to take her potassium pill. These are probably premature ventricular contractions.  We'll continue with her current dose of potassium chloride. We'll check her labs today.

## 2013-05-14 ENCOUNTER — Telehealth: Payer: Self-pay | Admitting: *Deleted

## 2013-05-14 DIAGNOSIS — E785 Hyperlipidemia, unspecified: Secondary | ICD-10-CM

## 2013-05-14 NOTE — Telephone Encounter (Signed)
Pt agreed to plan/ lab date was given.

## 2013-05-14 NOTE — Telephone Encounter (Signed)
Message copied by Antony OdeaBRILEY, Adalia Pettis J on Thu May 14, 2013  4:17 PM ------      Message from: Vesta MixerNAHSER, PHILIP J      Created: Wed May 13, 2013  9:57 PM       She is not taking her atorvastatin o a regular basis.  Please have her start atorvastatin 20 mg a day.  Check fasting labs in 3 mohts       ------

## 2013-08-12 ENCOUNTER — Other Ambulatory Visit: Payer: Federal, State, Local not specified - PPO

## 2013-10-07 ENCOUNTER — Encounter: Payer: Self-pay | Admitting: Cardiovascular Disease

## 2013-10-15 ENCOUNTER — Other Ambulatory Visit: Payer: Self-pay

## 2013-10-15 DIAGNOSIS — R0602 Shortness of breath: Secondary | ICD-10-CM

## 2013-10-15 DIAGNOSIS — R002 Palpitations: Secondary | ICD-10-CM

## 2013-10-15 DIAGNOSIS — E785 Hyperlipidemia, unspecified: Secondary | ICD-10-CM

## 2013-10-15 MED ORDER — ATORVASTATIN CALCIUM 40 MG PO TABS: 40.0000 mg | ORAL_TABLET | Freq: Every day | ORAL | Status: DC

## 2013-11-11 ENCOUNTER — Ambulatory Visit (INDEPENDENT_AMBULATORY_CARE_PROVIDER_SITE_OTHER): Payer: Federal, State, Local not specified - PPO | Admitting: Cardiovascular Disease

## 2013-11-11 ENCOUNTER — Encounter: Payer: Self-pay | Admitting: Cardiovascular Disease

## 2013-11-11 VITALS — BP 110/80 | HR 74 | Ht 64.0 in | Wt 145.4 lb

## 2013-11-11 DIAGNOSIS — E785 Hyperlipidemia, unspecified: Secondary | ICD-10-CM

## 2013-11-11 NOTE — Assessment & Plan Note (Signed)
Ashley Mathews has not tolerated the atorvastatin very well. She's having lots of muscle aches and this has exacerbated her fibromyalgia. At this point we will stop her atorvastatin for her clinically. Her triglycerides have been significantly elevated. She also was told that she was prediabetic. I've encouraged her to continue to cut back on her carbohydrates. I've encouraged her to exercise everyday. See her again in 6 months we will readdress her hyperlipidemia.  She needs to see if this Dr. help her sleep

## 2013-11-11 NOTE — Progress Notes (Signed)
Ashley Mathews Date of Birth  [birth year]       [facility] [place]    [place] [address]  [address] [place], [place]  [address]   [place], [place]  [address] [phone]     [phone]   Fax  [phone]    Fax [phone]  Problem List: 1. Palpitations 2. Dyspnea with exertion 3. Hyperlipidemia 4. Autoimmune issues. 5. Hypothyroidism   History of Present Illness:  Ashley Mathews is a 53 yo who presents with complaints of DOE and some palpitations.  She gets some exercise - not as much as she would like.  Heel spurs keep her from walking much.  Occasionally does the elliptical.    She used to walk 3 miles 3 x a week last year.  Since her foot injury, she has gained some weight and has not been exercising much.    Denies any chest pain.    She describes occasional episodes of palpitations - last only a few seconds.  Typically last 1-2 seconds.   She has a strong family history of CAD and strokes.   Her cholesterol is 250.    05/13/2013:  Ashley Mathews has been having some stress issues - going through a separation from her husband.   She is not exercising.  She does not take her lipitor on a regular basis.   She ha lost 16 lbs.    She has noticed that her PVCs increased when she skips her potassium.    She has occaionally CP and dypsnea - but only with anxiety.  These usually resolve with deep breaths and relaxing.   November 11, 2013:  Ashley Mathews has had some issues with muscle aches - she's been taking the Crestor on a daily basis and notes that her muscles are much more sore. This has aggravated her fibromyalgia and she also notes that she's not sleeping well.    Current Outpatient Prescriptions on File Prior to Visit  Medication Sig Dispense Refill  . atorvastatin (LIPITOR) 40 MG tablet Take 1 tablet (40 mg total) by mouth daily.  30 tablet  4  . diphenhydrAMINE (SOMINEX) 25 MG tablet Take 25 mg by mouth as needed.      Marland Kitchen  HYDROcodone-acetaminophen (NORCO/VICODIN) 5-325 MG per tablet Take 1 tablet by mouth every 8 (eight) hours as needed for pain.  30 tablet  0  . imipramine (TOFRANIL) 25 MG tablet Take 25 mg by mouth daily. TAKE THREE TABS AT BED-TIME      . levothyroxine (SYNTHROID, LEVOTHROID) 100 MCG tablet Take 112 mcg by mouth daily before breakfast.       . potassium chloride (K-DUR,KLOR-CON) 10 MEQ tablet Take 1 tablet (10 mEq total) by mouth daily.  30 tablet  11  . sertraline (ZOLOFT) 100 MG tablet        No current facility-administered medications on file prior to visit.    Allergies  Allergen Reactions  . Sulfonamide Derivatives Anaphylaxis    Delayed anaphylaxis   . Amoxicillin   . Ciprofloxacin   . Clarithromycin     Serum sickness syndrome  . Doxycycline     REACTION: unknown  . Erythromycin   . Indomethacin   . Iodine   . Lidocaine     Headaches and nausea  . Penicillins   . Zithromax [Azithromycin]     Serum sickness syndrome    Past Medical History  Diagnosis Date  . Thyroid disorder   . Allergic  rhinitis   . Asthma   . Headache, chronic daily   . Lupus   . Fibromyalgia     No past surgical history on file.  History  Smoking status  . Never Smoker   Smokeless tobacco  . Never Used    History  Alcohol Use: Not on file    No family history on file.  Reviw of Systems:  Reviewed in the HPI.  All other systems are negative.  Physical Exam: Blood pressure 110/80, pulse 74, height 5\' 4"  (1.626 m), weight 145 lb 6.4 oz (65.953 kg). General: Well developed, well nourished, in no acute distress.  Head: Normocephalic, atraumatic, sclera non-icteric, mucus membranes are moist,   Neck: Supple. Carotids are 2 + without bruits. No JVD   Lungs: Clear   Heart: RR, normal S1, S2  Abdomen: Soft, non-tender, non-distended with normal bowel sounds.  Msk:  Strength and tone are normal   Extremities: No clubbing or cyanosis. No edema.  Distal pedal pulses are 2+ and  equal    Neuro: CN II - XII intact.  Alert and oriented X 3.   Psych:  Normal   ECG: November 11, 2013:  NSR at 2874.  Normal ECG   Assessment / Plan:

## 2013-11-11 NOTE — Patient Instructions (Addendum)
Your physician has recommended you make the following change in your medication:  STOP Atorvastatin   The patient is asked to make an attempt to improve diet and exercise patterns to aid in medical management of this problem.  Your physician wants you to follow-up in: 6 months with Dr. Elease HashimotoNahser.  You will receive a reminder letter in the mail two months in advance. If you don't receive a letter, please call our office to schedule the follow-up appointment.          [facility] Low Glycemic Diet (Source: [facility]) Low Glycemic Foods (20-49) (Decrease risk of developing heart disease) Breakfast Cereals: All-Bran All-Bran Fruit 'n Oats Fiber One Oatmeal (not instant) Oat bran Fruits and fruit juices: (Limit to 1-2 servings per day) Apples Apricots (fresh & dried) Blackberries Blueberries Cherries Cranberries Peaches Pears Plums Prunes Grapefruit Raspberries Strawberries Tangerine Apple juice Grapefruit juice Tomato juice Beans and legumes (fresh-cooked): Black-eyed peas Butter beans Chick peas Lentils  Green beans Lima beans Kidney beans Navy beans Pinto beans Snow peas Non-starchy vegetables: Asparagus, avocado, broccoli, cabbage, cauliflower, celery, cucumber, greens, lettuce, mushrooms, peppers, tomatoes, okra, onions, spinach, summer squash Grains: Barley Bulgur Rye Wild rice Nuts and oils : Almonds Peanuts Sunflower seeds Hazelnuts Pecans Walnuts Oils that are liquid at room temperature Dairy, fish, meat, soy, and eggs: Milk, skim Lowfat cheese Yogurt, lowfat, fruit sugar sweetened Lean red meat Fish  Skinless chicken & Malaw[place]urkey Shellfish Egg whites (up to 3 daily) Soy products  Egg yolks (up to 7 or _____ per week) Moderate Glycemic Foods (50-69) Breakfast Cereals: Bran Buds Bran Chex Just Right Mini-Wheats  Special K Swiss muesli Fruits: Banana (under-ripe) Dates Figs Grapes Kiwi Mango Oranges Raisins Fruit Juices: Cranberry  juice Orange juice Beans and legumes: Boston-type baked beans Canned pinto, kidney, or navy beans Green peas Vegetables: Beets Carrots  Sweet potato Yam Corn on the cob Breads: Pita (pocket) bread Oat bran bread Pumpernickel bread Rye bread Wheat bread, high fiber  Grains: Cornmeal Rice, brown Rice, white Couscous Pasta: Macaroni Pizza, cheese Ravioli, meat filled Spaghetti, white  Nuts: Cashews Macadamia Snacks: Chocolate Ice cream, lowfat Muffin Popcorn High Glycemic Foods (70-100)  Breakfast Cereals: Cheerios Corn Chex Corn Flakes Cream of Wheat Grape Nuts Grape Nut Flakes Grits Nutri-Grain Puffed Rice Puffed Wheat Rice Chex Rice Krispies Shredded Wheat Team Total Fruits: Pineapple Watermelon Banana (over-ripe) Beverages: Sodas, sweet tea, pineapple juice Vegetables: Potato, baked, boiled, fried, mashed JamaicaFrench fries Canned or frozen corn Parsnips Winter squash Breads: Most breads (white and whole grain) Bagels Bread sticks Bread stuffing Kaiser roll Dinner rolls Grains: Rice, instant Tapioca, with milk Candy and most cookies Snacks: Donuts Corn chips Jelly beans Pretzels Pastries       [facility]  All Rights Reserved  [employer]

## 2014-02-05 ENCOUNTER — Other Ambulatory Visit: Payer: Self-pay

## 2014-02-05 DIAGNOSIS — E876 Hypokalemia: Secondary | ICD-10-CM

## 2014-02-05 MED ORDER — POTASSIUM CHLORIDE CRYS ER 10 MEQ PO TBCR: 10.0000 meq | EXTENDED_RELEASE_TABLET | Freq: Every day | ORAL | Status: DC

## 2014-03-09 ENCOUNTER — Emergency Department (INDEPENDENT_AMBULATORY_CARE_PROVIDER_SITE_OTHER)
Admission: EM | Admit: 2014-03-09 | Discharge: 2014-03-09 | Disposition: A | Discharge status: Home / Self Care | Payer: Worker's Compensation | Source: Home / Self Care | Attending: Emergency Medicine | Admitting: Emergency Medicine

## 2014-03-09 ENCOUNTER — Encounter (HOSPITAL_COMMUNITY): Payer: Self-pay | Admitting: Emergency Medicine

## 2014-03-09 DIAGNOSIS — R51 Headache: Secondary | ICD-10-CM

## 2014-03-09 DIAGNOSIS — S161XXA Strain of muscle, fascia and tendon at neck level, initial encounter: Secondary | ICD-10-CM

## 2014-03-09 DIAGNOSIS — R519 Headache, unspecified: Secondary | ICD-10-CM

## 2014-03-09 MED ORDER — DICLOFENAC SODIUM 75 MG PO TBEC: 75.0000 mg | DELAYED_RELEASE_TABLET | Freq: Two times a day (BID) | ORAL | Status: DC

## 2014-03-09 MED ORDER — CYCLOBENZAPRINE HCL 5 MG PO TABS: 5.0000 mg | ORAL_TABLET | Freq: Three times a day (TID) | ORAL | Status: DC | PRN

## 2014-03-09 NOTE — Discharge Instructions (Signed)
TREATMENT  °Treatment initially involves the use of ice and medication to help reduce pain and inflammation. It is also important to perform strengthening and stretching exercises and modify activities that worsen symptoms so the injury does not get worse. These exercises may be performed at home or with a therapist. For patients who experience severe symptoms, a soft padded collar may be recommended to be worn around the neck.  °Improving your posture may help reduce symptoms. Posture improvement includes pulling your chin and abdomen in while sitting or standing. If you are sitting, sit in a firm chair with your buttocks against the back of the chair. While sleeping, try replacing your pillow with a small towel rolled to 2 inches in diameter, or use a cervical pillow. Poor sleeping positions delay healing.  ° °MEDICATION  °· If pain medication is necessary, nonsteroidal anti-inflammatory medications, such as aspirin and ibuprofen, or other minor pain relievers, such as acetaminophen, are often recommended. °· Do not take pain medication for 7 days before surgery. °· Prescription pain relievers may be given if deemed necessary by your caregiver. Use only as directed and only as much as you need. ° °HEAT AND COLD:  °· Cold treatment (icing) relieves pain and reduces inflammation. Cold treatment should be applied for 10 to 15 minutes every 2 to 3 hours for inflammation and pain and immediately after any activity that aggravates your symptoms. Use ice packs or an ice massage. °· Heat treatment may be used prior to performing the stretching and strengthening activities prescribed by your caregiver, physical therapist, or athletic trainer. Use a heat pack or a warm soak. ° °SEEK MEDICAL CARE IF:  °· Symptoms get worse or do not improve in 2 weeks despite treatment. °· New, unexplained symptoms develop (drugs used in treatment may produce side effects). ° °EXERCISES °RANGE OF MOTION (ROM) AND STRETCHING EXERCISES -  Cervical Strain and Sprain °These exercises may help you when beginning to rehabilitate your injury. In order to successfully resolve your symptoms, you must improve your posture. These exercises are designed to help reduce the forward-head and rounded-shoulder posture which contributes to this condition. Your symptoms may resolve with or without further involvement from your physician, physical therapist or athletic trainer. While completing these exercises, remember:  °· Restoring tissue flexibility helps normal motion to return to the joints. This allows healthier, less painful movement and activity. °· An effective stretch should be held for at least 20 seconds, although you may need to begin with shorter hold times for comfort. °· A stretch should never be painful. You should only feel a gentle lengthening or release in the stretched tissue. ° °STRETCH- Axial Extensors °· Lie on your back on the floor. You may bend your knees for comfort. Place a rolled up hand towel or dish towel, about 2 inches in diameter, under the part of your head that makes contact with the floor. °· Gently tuck your chin, as if trying to make a "double chin," until you feel a gentle stretch at the base of your head. °· Hold _____10_____ seconds. °Repeat _____10_____ times. Complete this exercise _____2_____ times per day.  ° °STRETECH - Axial Extension  °· Stand or sit on a firm surface. Assume a good posture: chest up, shoulders drawn back, abdominal muscles slightly tense, knees unlocked (if standing) and feet hip width apart. °· Slowly retract your chin so your head slides back and your chin slightly lowers.Continue to look straight ahead. °· You should feel a gentle stretch   in the back of your head. Be certain not to feel an aggressive stretch since this can cause headaches later. °· Hold for ____10______ seconds. °Repeat _____10_____ times. Complete this exercise ____2______ times per day. ° °STRETCH  Cervical Side Bend  °· Stand  or sit on a firm surface. Assume a good posture: chest up, shoulders drawn back, abdominal muscles slightly tense, knees unlocked (if standing) and feet hip width apart. °· Without letting your nose or shoulders move, slowly tip your right / left ear to your shoulder until your feel a gentle stretch in the muscles on the opposite side of your neck. °· Hold _____10_____ seconds. °Repeat _____10_____ times. Complete this exercise _____2_____ times per day. ° °STRETCH  Cervical Rotators  °· Stand or sit on a firm surface. Assume a good posture: chest up, shoulders drawn back, abdominal muscles slightly tense, knees unlocked (if standing) and feet hip width apart. °· Keeping your eyes level with the ground, slowly turn your head until you feel a gentle stretch along the back and opposite side of your neck. °· Hold _____10_____ seconds. °Repeat ____10______ times. Complete this exercise ____2______ times per day. ° °RANGE OF MOTION - Neck Circles  °· Stand or sit on a firm surface. Assume a good posture: chest up, shoulders drawn back, abdominal muscles slightly tense, knees unlocked (if standing) and feet hip width apart. °· Gently roll your head down and around from the back of one shoulder to the back of the other. The motion should never be forced or painful. °· Repeat the motion 10-20 times, or until you feel the neck muscles relax and loosen. °Repeat ____10______ times. Complete the exercise _____2_____ times per day. ° °STRENGTHENING EXERCISES - Cervical Strain and Sprain °These exercises may help you when beginning to rehabilitate your injury. They may resolve your symptoms with or without further involvement from your physician, physical therapist or athletic trainer. While completing these exercises, remember:  °· Muscles can gain both the endurance and the strength needed for everyday activities through controlled exercises. °· Complete these exercises as instructed by your physician, physical therapist or  athletic trainer. Progress the resistance and repetitions only as guided. °· You may experience muscle soreness or fatigue, but the pain or discomfort you are trying to eliminate should never worsen during these exercises. If this pain does worsen, stop and make certain you are following the directions exactly. If the pain is still present after adjustments, discontinue the exercise until you can discuss the trouble with your clinician. ° °STRENGTH Cervical Flexors, Isometric °· Face a wall, standing about 6 inches away. Place a small pillow, a ball about 6-8 inches in diameter, or a folded towel between your forehead and the wall. °· Slightly tuck your chin and gently push your forehead into the soft object. Push only with mild to moderate intensity, building up tension gradually. Keep your jaw and forehead relaxed. °· Hold 10 to 20 seconds. Keep your breathing relaxed. °· Release the tension slowly. Relax your neck muscles completely before you start the next repetition. °Repeat _____10_____ times. Complete this exercise _____2_____ times per day. ° °STRENGTH- Cervical Lateral Flexors, Isometric  °· Stand about 6 inches away from a wall. Place a small pillow, a ball about 6-8 inches in diameter, or a folded towel between the side of your head and the wall. °· Slightly tuck your chin and gently tilt your head into the soft object. Push only with mild to moderate intensity, building up tension gradually. Keep   your jaw and forehead relaxed. °· Hold 10 to 20 seconds. Keep your breathing relaxed. °· Release the tension slowly. Relax your neck muscles completely before you start the next repetition. °Repeat _____10_____ times. Complete this exercise ____2______ times per day. ° °STRENGTH  Cervical Extensors, Isometric  °· Stand about 6 inches away from a wall. Place a small pillow, a ball about 6-8 inches in diameter, or a folded towel between the back of your head and the wall. °· Slightly tuck your chin and gently  tilt your head back into the soft object. Push only with mild to moderate intensity, building up tension gradually. Keep your jaw and forehead relaxed. °· Hold 10 to 20 seconds. Keep your breathing relaxed. °· Release the tension slowly. Relax your neck muscles completely before you start the next repetition. °Repeat _____10_____ times. Complete this exercise _____2_____ times per day. ° °POSTURE AND BODY MECHANICS CONSIDERATIONS - Cervical Strain and Sprain °Keeping correct posture when sitting, standing or completing your activities will reduce the stress put on different body tissues, allowing injured tissues a chance to heal and limiting painful experiences. The following are general guidelines for improved posture. Your physician or physical therapist will provide you with any instructions specific to your needs. While reading these guidelines, remember: °· The exercises prescribed by your provider will help you have the flexibility and strength to maintain correct postures. °· The correct posture provides the optimal environment for your joints to work. All of your joints have less wear and tear when properly supported by a spine with good posture. This means you will experience a healthier, less painful body. °· Correct posture must be practiced with all of your activities, especially prolonged sitting and standing. Correct posture is as important when doing repetitive low-stress activities (typing) as it is when doing a single heavy-load activity (lifting). °PROLONGED STANDING WHILE SLIGHTLY LEANING FORWARD °When completing a task that requires you to lean forward while standing in one place for a long time, place either foot up on a stationary 2-4 inch high object to help maintain the best posture. When both feet are on the ground, the low back tends to lose its slight inward curve. If this curve flattens (or becomes too large), then the back and your other joints will experience too much stress, fatigue  more quickly and can cause pain.  °RESTING POSITIONS °Consider which positions are most painful for you when choosing a resting position. If you have pain with flexion-based activities (sitting, bending, stooping, squatting), choose a position that allows you to rest in a less flexed posture. You would want to avoid curling into a fetal position on your side. If your pain worsens with extension-based activities (prolonged standing, working overhead), avoid resting in an extended position such as sleeping on your stomach. Most people will find more comfort when they rest with their spine in a more neutral position, neither too rounded nor too arched. Lying on a non-sagging bed on your side with a pillow between your knees, or on your back with a pillow under your knees will often provide some relief. Keep in mind, being in any one position for a prolonged period of time, no matter how correct your posture, can still lead to stiffness. °WALKING °Walk with an upright posture. Your ears, shoulders and hips should all line-up. °OFFICE WORK °When working at a desk, create an environment that supports good, upright posture. Without extra support, muscles fatigue and lead to excessive strain on joints and other tissues. °  CHAIR: °· A chair should be able to slide under your desk when your back makes contact with the back of the chair. This allows you to work closely. °· The chair's height should allow your eyes to be level with the upper part of your monitor and your hands to be slightly lower than your elbows. °· Body position: °· Your feet should make contact with the floor. If this is not possible, use a foot rest. °· Keep your ears over your shoulders. This will reduce stress on your neck and low back. °Document Released: 04/02/2005 Document Revised: 06/25/2011 Document Reviewed: 07/15/2008 °[employer]® Patient Information ©2013 [employer]. ° ° °
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
## 2014-09-07 ENCOUNTER — Other Ambulatory Visit: Payer: Self-pay | Admitting: Gynecology

## 2014-09-08 LAB — CYTOLOGY - PAP

## 2014-12-22 ENCOUNTER — Ambulatory Visit: Payer: Self-pay | Admitting: Cardiovascular Disease

## 2014-12-28 ENCOUNTER — Encounter: Payer: Self-pay | Admitting: Cardiovascular Disease

## 2014-12-28 ENCOUNTER — Ambulatory Visit (INDEPENDENT_AMBULATORY_CARE_PROVIDER_SITE_OTHER): Payer: 59 | Admitting: Cardiovascular Disease

## 2014-12-28 VITALS — BP 110/80 | HR 73 | Ht 64.0 in | Wt 144.0 lb

## 2014-12-28 DIAGNOSIS — R0600 Dyspnea, unspecified: Secondary | ICD-10-CM

## 2014-12-28 DIAGNOSIS — R002 Palpitations: Secondary | ICD-10-CM

## 2014-12-28 DIAGNOSIS — E785 Hyperlipidemia, unspecified: Secondary | ICD-10-CM

## 2014-12-28 NOTE — Patient Instructions (Signed)
Medication Instructions:  Your physician recommends that you continue on your current medications as directed. Please refer to the Current Medication list given to you today.   Labwork: None Ordered   Testing/Procedures: None Ordered   Follow-Up: Your physician recommends that you schedule a follow-up appointment in: as needed with Dr. Nahser     
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
## 2015-08-16 ENCOUNTER — Other Ambulatory Visit: Payer: Self-pay | Admitting: Internal Medicine

## 2015-08-16 DIAGNOSIS — R102 Pelvic and perineal pain: Secondary | ICD-10-CM

## 2015-08-19 ENCOUNTER — Ambulatory Visit
Admission: RE | Admit: 2015-08-19 | Discharge: 2015-08-19 | Disposition: A | Discharge status: Home / Self Care | Payer: 59 | Source: Ambulatory Visit | Attending: Internal Medicine | Admitting: Internal Medicine

## 2015-08-19 DIAGNOSIS — R102 Pelvic and perineal pain: Secondary | ICD-10-CM

## 2015-09-20 ENCOUNTER — Encounter: Payer: Self-pay | Admitting: Allergy and Immunology

## 2015-09-20 ENCOUNTER — Ambulatory Visit (INDEPENDENT_AMBULATORY_CARE_PROVIDER_SITE_OTHER): Payer: 59 | Admitting: Allergy and Immunology

## 2015-09-20 VITALS — BP 126/80 | HR 88 | Temp 97.8°F | Resp 16 | Ht 63.98 in | Wt 148.4 lb

## 2015-09-20 DIAGNOSIS — Z91018 Allergy to other foods: Secondary | ICD-10-CM

## 2015-09-20 DIAGNOSIS — L259 Unspecified contact dermatitis, unspecified cause: Secondary | ICD-10-CM | POA: Diagnosis not present

## 2015-09-20 MED ORDER — AUVI-Q 0.3 MG/0.3ML IJ SOAJ: INTRAMUSCULAR | Status: DC

## 2015-09-20 NOTE — Patient Instructions (Addendum)
  1. Return to clinic Friday morning at 8:30 for initial patch test read  2. Auvi-Q 3.0, Benadryl, M.D./ER for allergic reaction

## 2015-09-20 NOTE — Progress Notes (Signed)
Dear Dr. Venetia MaxonStern,  Thank you for referring Ashley Ghazierri M Saban to the [facility] on 09/20/2015.   Below is a summation of this patient's evaluation and recommendations.  Thank you for your referral. I will keep you informed about this patient's response to treatment.   If you have any questions please to do hestitate to contact me.   Sincerely,  Jessica PriestEric J. Kozlow, MD [facility]   ______________________________________________________________________    NEW PATIENT NOTE  Referring Provider: Maeola HarmanStern, Joseph, MD Primary Provider: Londell MohPHARR,WALTER DAVIDSON, MD Date of office visit: 09/20/2015    Subjective:   Chief Complaint:  Ashley Mathews (DOB: 07/27/1960) is a 55 y.o. female with a chief complaint of Other  who presents to the clinic on 09/20/2015 with the following problems:  HPI: Ashley Mathews presents to this clinic in evaluation of possible metal allergy. She will be undergoing a cervical disc procedure requiring installation of metallic hardware sometime in the near future and she is being evaluated for possible metal allergy given her history of developing problems with localized swelling reactions upon exposure to earrings. There is also a family history of titanium allergy.   She does not have a very strong history for other atopic disease. She did have asthma as a child. She apparently developed serum sickness from the administration of several different antibiotics. She has a history of developing nasal congestion and red spots on her body after eating peaches and strawberries and her stomach gets upset as well. Yeast and soy gives her red spots on her body. She did have an EpiPen in the past but no longer carries this device.  Past Medical History  Diagnosis Date  . Thyroid disorder   . Allergic rhinitis   . Asthma   . Headache, chronic daily   . Lupus (HCC)   . Fibromyalgia     Past  Surgical History  Procedure Laterality Date  . Cholecystectomy  2011  . Partial hysterectomy  1999  . Cesarean section  1987  . Laparoscopic endometriosis fulguration    . Myringotomy        Medication List           cetirizine 10 MG tablet  Commonly known as:  ZYRTEC  Take 10 mg by mouth daily.     diphenhydrAMINE 25 MG tablet  Commonly known as:  SOMINEX  Take 25 mg by mouth as needed for allergies.     imipramine 25 MG tablet  Commonly known as:  TOFRANIL  TAKE THREE TABS AT BED-TIME     levothyroxine 100 MCG tablet  Commonly known as:  SYNTHROID, LEVOTHROID  Take 100 mcg by mouth daily.     NAPROXEN SODIUM PO  Take 220 mg by mouth as needed.     VITAMIN B 12 PO  Take 1 tablet by mouth daily.     Vitamin D3 5000 units Caps  Take 1 capsule by mouth daily.        Allergies  Allergen Reactions  . Sulfa Antibiotics Anaphylaxis  . Sulfonamide Derivatives Anaphylaxis    Delayed anaphylaxis   . Amoxicillin   . Amoxicillin-Pot Clavulanate Hives  . Cephalexin Hives  . Ciprofloxacin   . Clarithromycin Hives    Serum sickness syndrome  . Doxycycline     REACTION: unknown  . Erythromycin Hives and Other (See Comments)    Headaches  . Indomethacin   . Iodine   .  Lidocaine     Headaches and nausea  . Penicillins   . Xylocaine  [Lidocaine Hcl] Other (See Comments)    Headaches and HIves  . Zithromax [Azithromycin] Hives    Serum sickness syndrome    Review of systems negative except as noted in HPI / PMHx or noted below:  Review of Systems  Constitutional: Negative.   HENT: Negative.   Eyes: Negative.   Respiratory: Negative.   Cardiovascular: Negative.   Gastrointestinal: Negative.   Genitourinary: Negative.   Musculoskeletal: Negative.   Skin: Negative.   Neurological: Negative.   Endo/Heme/Allergies: Negative.   Psychiatric/Behavioral: Negative.     Family History  Problem Relation Age of Onset  . Heart disease Mother   . Hypertension  Mother   . Stroke Mother   . Allergic rhinitis Mother   . Scleroderma Mother   . Heart murmur Brother   . Heart attack Maternal Grandfather   . Lung cancer Maternal Aunt   . Heart disease Maternal Aunt   . Lung cancer Maternal Uncle   . Heart disease Maternal Uncle   . Heart disease Maternal Uncle   . Heart disease Maternal Uncle     Social History   Social History  . Marital Status: Married    Spouse Name: N/A  . Number of Children: N/A  . Years of Education: N/A   Occupational History  . Not on file.   Social History Main Topics  . Smoking status: Never Smoker   . Smokeless tobacco: Never Used  . Alcohol Use: Yes     Comment: rare  . Drug Use: No  . Sexual Activity: Not on file   Other Topics Concern  . Not on file   Social History Narrative    Environmental and Social history  Lives in a condominium in with a dry environment, no animals located inside the household, no carpeting in the bedroom, plastic on the bed and pillow, and no smokers located inside the household. 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   Objective:   Filed Vitals:   09/20/15 0824  BP: 126/80  Pulse: 88  Temp: 97.8 F (36.6 C)  Resp: 16   Height: 5' 3.98" (162.5 cm) Weight: 148 lb 6.4 oz (67.314 kg)  Physical Exam  Constitutional: She is well-developed, well-nourished, and in no distress.  HENT:  Head: Normocephalic. Head is without right periorbital erythema and without left periorbital erythema.  Right Ear: External ear and ear canal normal. Tympanic membrane is perforated.  Left Ear: Tympanic membrane, external ear and ear canal normal.  Nose: Nose normal. No mucosal edema or rhinorrhea.  Mouth/Throat: Oropharynx is clear and moist and mucous membranes are normal. No oropharyngeal exudate.  Eyes: Conjunctivae and lids are normal. Pupils are equal, round, and reactive to light.  Neck: Trachea normal. No tracheal deviation present. No thyromegaly present.    Cardiovascular: Normal rate, regular rhythm, S1 normal, S2 normal and normal heart sounds.   No murmur heard. Pulmonary/Chest: Effort normal. No stridor. No tachypnea. No respiratory distress. She has no wheezes. She has no rales. She exhibits no tenderness.  Abdominal: Soft. She exhibits no distension and no mass. There is no hepatosplenomegaly. There is no tenderness. There is no rebound and no guarding.  Musculoskeletal: She exhibits no edema or tenderness.  Lymphadenopathy:       Head (right side): No tonsillar adenopathy present.       Head (left side): No tonsillar adenopathy present.    She  has no cervical adenopathy.    She has no axillary adenopathy.  Neurological: She is alert. Gait normal.  Skin: No rash noted. She is not diaphoretic. No erythema. No pallor. Nails show no clubbing.  Psychiatric: Mood and affect normal.     Diagnostics: Metal patch testing was placed on her back during today's visit. Allergens included chromium chloride, potassium dichromate, cobalt chloride hexahydrate, copper sulfate pentahydrate, molybdenum chloride, titanium, manganese chloride, nickel sulfate hexahydrate.    Assessment and Plan:    1. Dermatitis, contact   2. Food allergy     1. Return to clinic Friday morning at 8:30 for initial patch test read  2. Auvi-Q 3.0, Benadryl, M.D./ER for allergic reaction  Ashley Mathews will return to this clinic for a 72 hour reading on her patch testing and based upon those results we will make a determination about whether or not she needs to return for seven-day reading. As well, I gave her a epinephrine autoinjector device to be used if she ever does develop a significant allergic reaction against consumption of specific foods. She is quite aware of the foods that give rise to problems and she has been very good about avoiding these agents for years.  Jessica Priest, MD [facility]

## 2015-09-23 ENCOUNTER — Encounter: Payer: 59 | Admitting: Allergy and Immunology

## 2015-09-23 NOTE — Progress Notes (Addendum)
       FOLLOW UP NOTE  RE: Ashley Mathews MRN: [id number] DOB: Nov 11, 1960 [facility] [address] [place] [id number] Date of Office Visit: 09/23/2015    Ashley Mathews returns to the office for removal of her metal testing patch.  She reports intermittent itching though not specific to her back, nor any specific irritation at the upper right area of her back.  She otherwise has been feeling well but thought she noted a metal taste in her mouth.  She has remained off antihistamines.  No new questions or concerns.   EXAM:  HR  82  RR 16  BP 122/84 General:  Alert interactive communicating easily. Back:  No acute rashes or lesions, patch test area with slight erythema at tape site only.   DIAGNOSTICS:  Metal testing reading at 72 hours:   8 sites -- tiny slight erythematous papular lesion at upper left corner of copper sulfate pentahydrate 2% and all others negative.  See flowsheet.   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  PLAN:  1.  Ashley Mathews will continue to keep back clean and dry, no submersion of back or showering to back.   2.  Remain off antihistamines.   3.  Follow-up with Dr. Lucie LeatherKozlow on Tuesday 6/13 for final review.    Roselyn M. Willa RoughHicks, MD [facility]  Addendum: Ashley Mathews returns for her one-week Ashley Mathews of patch testing. All contact allergen patch tests were negative.   Jessica PriestEric J. Kozlow, MD

## 2015-09-27 ENCOUNTER — Encounter: Payer: 59 | Admitting: Allergy and Immunology

## 2015-11-15 HISTORY — PX: CERVICAL FUSION: SHX112

## 2015-12-22 ENCOUNTER — Encounter: Payer: Self-pay | Admitting: Sports Medicine

## 2015-12-22 ENCOUNTER — Ambulatory Visit (INDEPENDENT_AMBULATORY_CARE_PROVIDER_SITE_OTHER): Payer: 59 | Admitting: Sports Medicine

## 2015-12-22 DIAGNOSIS — M25561 Pain in right knee: Secondary | ICD-10-CM | POA: Insufficient documentation

## 2015-12-22 DIAGNOSIS — M25562 Pain in left knee: Secondary | ICD-10-CM | POA: Diagnosis not present

## 2015-12-22 MED ORDER — MELOXICAM 7.5 MG PO TABS: 7.5000 mg | ORAL_TABLET | Freq: Two times a day (BID) | ORAL | 2 refills | Status: DC

## 2015-12-22 NOTE — Assessment & Plan Note (Signed)
I would still be concerned about her having an autoimmune process I cannot find a structural cause her to have knee pain or swelling I would like to give her a 1 month treatment course with anti-inflammatories and see if this makes much difference Mobic 7.5 once or twice daily Trial with compression sleeve  Return to see me after one month  If not resolving we may need to do some laboratory testing although she has had this in the past

## 2015-12-22 NOTE — Progress Notes (Signed)
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  By the end of the day she is getting bilateral knee pain She also gets swelling She denies a specific injury  At age 55 she had serum sickness secondary to taking penicillin At the time this caused significant knee pain and swelling She questions whether the 2 are related  Another concern is that she has been evaluated by rheumatology Possibility of auto- immune disorder She was treated for fibromyalgia with imipramine and that has helped  Past medical history Thyroid disease for which she takes replacement Recent cervical fusion We treated her for plantar fasciitis which has resolved  Review of systems No locking No giving way Muscle aching in the thigh and lower leg particularly with certain activities  Physical examination Pleasant female in no acute distress She has a healing neck scar BP 113/62   Ht 5\' 4"  (1.626 m)   Wt 140 lb (63.5 kg)   BMI 24.03 kg/m   RT Knee: Normal to inspection with no erythema or effusion or obvious bony abnormalities. Palpation normal with no warmth or joint line tenderness or patellar tenderness or condyle tenderness. ROM normal in flexion and extension and lower leg rotation. Ligaments with solid consistent endpoints including ACL, PCL, LCL, MCL. Negative Mcmurray's and provocative meniscal tests. Non painful patellar compression. Patellar and quadriceps tendons unremarkable. Hamstring and quadriceps strength is normal.  LT Knee Exam was same with no specific abnormal findings  Ultrasound of bilateral knees No suprapatellar pouch effusions Normal medial and lateral meniscus Normal quadriceps and patellar tendons

## 2016-01-19 ENCOUNTER — Encounter: Payer: Self-pay | Admitting: Sports Medicine

## 2016-01-19 ENCOUNTER — Ambulatory Visit (INDEPENDENT_AMBULATORY_CARE_PROVIDER_SITE_OTHER): Payer: 59 | Admitting: Sports Medicine

## 2016-01-19 VITALS — BP 124/69 | HR 107 | Ht 64.0 in | Wt 140.0 lb

## 2016-01-19 DIAGNOSIS — M25562 Pain in left knee: Secondary | ICD-10-CM

## 2016-01-19 DIAGNOSIS — M25561 Pain in right knee: Secondary | ICD-10-CM

## 2016-01-19 DIAGNOSIS — G8929 Other chronic pain: Secondary | ICD-10-CM | POA: Diagnosis not present

## 2016-01-19 MED ORDER — TRAMADOL HCL 50 MG PO TABS: 50.0000 mg | ORAL_TABLET | Freq: Two times a day (BID) | ORAL | 3 refills | Status: DC | PRN

## 2016-01-19 NOTE — Assessment & Plan Note (Signed)
See OV note  We will screen for autoimmune change Note CBC in April was not remarkable  Trial w tramadol

## 2016-01-19 NOTE — Progress Notes (Signed)
Subjective:  Patient ID: Ashley Mathews, female    DOB: [birth year]  Age: 55 y.o. MRN: [id number]  CC: Bilateral Knee Pain  HPI Ashley Mathews is a 55 year old female with a PMH significant for fibromyalagia presents today complaining of bilateral knee pain. She has had knee pain for the past 5-7 years, but reports that it has gotten noticeably worse within the last 2. She reports that the knee pain mostly occurs when she kneels down, reporting that after doing this once or twice her knees turn red and get hot. She describes the pain as a burning pain that only occurs on the medial portion of the knees. Occasionally she will have pain with walking as well localized to the inferior portion of the knees. She reports that occasionally there is mild swelling when she aggravates her knees a lot. Ice and elevation give her mild relief when the knee is aggravated, but usually going to bed provides the most relief. Patient reports 6 lb weight loss, but says it is because she is still recovering from her cervical fusion. Patient denies any tingling or numbness.  Past Medical History:  Diagnosis Date  . Allergic rhinitis   . Asthma   . Fibromyalgia   . Headache, chronic daily   . Lupus   . Thyroid disorder    Note past Dx of Lupus never confirmed/  Hx of serum sickness to penicillin that led to swelling in both knees.  Past Surgical History:  Procedure Laterality Date  . CESAREAN SECTION  1987  . CHOLECYSTECTOMY  2011  . LAPAROSCOPIC ENDOMETRIOSIS FULGURATION    . MYRINGOTOMY    . 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  ROS Review of Systems  Musculoskeletal:       Bilateral Knee Pain      Mild Bilateral Knee Swelling  All other systems reviewed and are negative. no locking No giving way although feels unsteady with bending  Objective:  BP 124/69   Pulse (!) 107   Ht _0  (1.626 m)   Wt 140 lb (63.5 kg)   BMI 24.03  kg/m   Physical Exam  Constitutional: She appears well-developed and well-nourished.  Musculoskeletal:  Knee: No lesions, deformities or effusions. No tenderness. Normal ROM, equal and normal strength bilaterally. ACL, PCL, LCL, MCL intact. Negative McMurray's test. Neurovascularly intact.    No limp with walking No other joint swelling   Assessment & Plan:  1. Bilateral Knee Pain  Knee exam was negative. Has good strength and good ROM for her age. She has had high ANA during pregnancy in her past and her mother is also having similar rheumatological problems. Would like to see if knee pain is rheumatological in nature. Ordered a ESR, ANA, and RF. Patient reports that the meloxicam is not helping her all that much. Will order tramadol to see if that will help for her knee pain. We will follow up in about 2 months.  Orders Placed This Encounter  Procedures  . Sed Rate (ESR)    Standing Status:   Future    Number of Occurrences:   1    Standing Expiration Date:   01/18/2017  . Antinuclear Antib (ANA)    Standing Status:   Future    Number of Occurrences:   1    Standing Expiration Date:   01/18/2017  . Rheumatoid Factor    Standing Status:   Future  Number of Occurrences:   1    Standing Expiration Date:   01/18/2017   Meds ordered this encounter  Medications  . traMADol (ULTRAM) 50 MG tablet    Sig: Take 1 tablet (50 mg total) by mouth every 12 (twelve) hours as needed.    Dispense:  60 tablet    Refill:  3    Follow-up: 2 Months  Salvadore Dom, Medical Student  I observed and examined the patient with the medical studentt and agree with assessment and plan.  Note reviewed and modified by me. Ila Mcgill, MD

## 2016-01-20 LAB — ANA: Anti Nuclear Antibody(ANA): POSITIVE — AB

## 2016-01-20 LAB — SEDIMENTATION RATE: SED RATE: 13 mm/h (ref 0–30)

## 2016-01-20 LAB — ANTI-NUCLEAR AB-TITER (ANA TITER)

## 2016-01-20 LAB — RHEUMATOID FACTOR: Rhuematoid fact SerPl-aCnc: <14 IU/mL (ref <14)

## 2016-02-01 ENCOUNTER — Encounter: Payer: Self-pay | Admitting: Sports Medicine

## 2016-06-13 DIAGNOSIS — Z23 Encounter for immunization: Secondary | ICD-10-CM | POA: Diagnosis not present

## 2016-08-17 DIAGNOSIS — Z01419 Encounter for gynecological examination (general) (routine) without abnormal findings: Secondary | ICD-10-CM | POA: Diagnosis not present

## 2016-09-06 DIAGNOSIS — G4733 Obstructive sleep apnea (adult) (pediatric): Secondary | ICD-10-CM | POA: Diagnosis not present

## 2016-10-07 DIAGNOSIS — G4733 Obstructive sleep apnea (adult) (pediatric): Secondary | ICD-10-CM | POA: Diagnosis not present

## 2016-10-08 DIAGNOSIS — E039 Hypothyroidism, unspecified: Secondary | ICD-10-CM | POA: Diagnosis not present

## 2016-10-08 DIAGNOSIS — E162 Hypoglycemia, unspecified: Secondary | ICD-10-CM | POA: Diagnosis not present

## 2016-10-10 DIAGNOSIS — E039 Hypothyroidism, unspecified: Secondary | ICD-10-CM | POA: Diagnosis not present

## 2016-10-10 DIAGNOSIS — E162 Hypoglycemia, unspecified: Secondary | ICD-10-CM | POA: Diagnosis not present

## 2016-10-30 DIAGNOSIS — M79641 Pain in right hand: Secondary | ICD-10-CM | POA: Diagnosis not present

## 2016-10-30 DIAGNOSIS — D8989 Other specified disorders involving the immune mechanism, not elsewhere classified: Secondary | ICD-10-CM | POA: Diagnosis not present

## 2016-10-30 DIAGNOSIS — M797 Fibromyalgia: Secondary | ICD-10-CM | POA: Diagnosis not present

## 2016-10-30 DIAGNOSIS — M2550 Pain in unspecified joint: Secondary | ICD-10-CM | POA: Diagnosis not present

## 2016-10-30 DIAGNOSIS — R768 Other specified abnormal immunological findings in serum: Secondary | ICD-10-CM | POA: Diagnosis not present

## 2016-11-06 DIAGNOSIS — G4733 Obstructive sleep apnea (adult) (pediatric): Secondary | ICD-10-CM | POA: Diagnosis not present

## 2016-11-08 ENCOUNTER — Ambulatory Visit (INDEPENDENT_AMBULATORY_CARE_PROVIDER_SITE_OTHER): Payer: 59

## 2016-11-08 ENCOUNTER — Ambulatory Visit (HOSPITAL_COMMUNITY)
Admission: EM | Admit: 2016-11-08 | Discharge: 2016-11-08 | Disposition: A | Discharge status: Home / Self Care | Payer: 59 | Attending: Family Medicine | Admitting: Family Medicine

## 2016-11-08 ENCOUNTER — Ambulatory Visit (HOSPITAL_COMMUNITY): Payer: 59

## 2016-11-08 ENCOUNTER — Encounter (HOSPITAL_COMMUNITY): Payer: Self-pay

## 2016-11-08 DIAGNOSIS — R51 Headache: Secondary | ICD-10-CM | POA: Diagnosis not present

## 2016-11-08 DIAGNOSIS — T148XXA Other injury of unspecified body region, initial encounter: Secondary | ICD-10-CM | POA: Diagnosis not present

## 2016-11-08 DIAGNOSIS — R519 Headache, unspecified: Secondary | ICD-10-CM

## 2016-11-08 DIAGNOSIS — M542 Cervicalgia: Secondary | ICD-10-CM | POA: Diagnosis not present

## 2016-11-08 DIAGNOSIS — W19XXXA Unspecified fall, initial encounter: Secondary | ICD-10-CM

## 2016-11-08 DIAGNOSIS — T07XXXA Unspecified multiple injuries, initial encounter: Secondary | ICD-10-CM

## 2016-11-08 MED ORDER — CYCLOBENZAPRINE HCL 10 MG PO TABS: 10.0000 mg | ORAL_TABLET | Freq: Two times a day (BID) | ORAL | 0 refills | Status: DC | PRN

## 2016-11-08 MED ORDER — ACETAMINOPHEN 325 MG PO TABS
ORAL_TABLET | ORAL | Status: AC
  Filled 2016-11-08: qty 3

## 2016-11-08 MED ORDER — KETOROLAC TROMETHAMINE 30 MG/ML IJ SOLN
30.0000 mg | Freq: Once | INTRAMUSCULAR | Status: AC
  Administered 2016-11-08: 30 mg via INTRAMUSCULAR

## 2016-11-08 MED ORDER — ACETAMINOPHEN 325 MG PO TABS
975.0000 mg | ORAL_TABLET | Freq: Once | ORAL | Status: AC
  Administered 2016-11-08: 975 mg via ORAL

## 2016-11-08 MED ORDER — KETOROLAC TROMETHAMINE 30 MG/ML IJ SOLN
INTRAMUSCULAR | Status: AC
  Filled 2016-11-08: qty 1

## 2016-11-08 NOTE — ED Provider Notes (Signed)
CSN: [id number]     Arrival date & time 11/08/16  1927 History   First MD Initiated Contact with Patient 11/08/16 2033     Chief Complaint  Patient presents with  . Fall   (Consider location/radiation/quality/duration/timing/severity/associated sxs/prior Treatment) Patient fell and hit her right face and cheek and right knee and left elbow and she is c/o neck pain, and headaches.   The history is provided by the patient.  Fall  This is a new problem. The problem occurs constantly. The problem has not changed since onset.Nothing aggravates the symptoms. Nothing relieves the symptoms. She has tried nothing for the symptoms.    Past Medical History:  Diagnosis Date  . Allergic rhinitis   . Asthma   . Fibromyalgia   . Headache, chronic daily   . Lupus   . Thyroid disorder    Past Surgical History:  Procedure Laterality Date  . CESAREAN SECTION  1987  . CHOLECYSTECTOMY  2011  . LAPAROSCOPIC ENDOMETRIOSIS FULGURATION    . MYRINGOTOMY    . PARTIAL HYSTERECTOMY  1999   Family History  Problem Relation Age of Onset  . Heart disease Mother   . Hypertension Mother   . Stroke Mother   . Allergic rhinitis Mother   . Scleroderma Mother   . Heart attack Maternal Grandfather   . Heart murmur Brother   . Lung cancer Maternal Aunt   . Heart disease Maternal Aunt   . Lung cancer Maternal Uncle   . Heart disease Maternal Uncle   . Heart disease Maternal Uncle   . Heart disease Maternal Uncle    Social History  Substance Use Topics  . Smoking status: Never Smoker  . Smokeless tobacco: Never Used  . Alcohol use Yes     Comment: rare   OB History    No data available     Review of Systems  Constitutional: Negative.   HENT: Negative.   Eyes: Negative.   Respiratory: Negative.   Cardiovascular: Negative.   Gastrointestinal: Negative.   Endocrine: Negative.   Genitourinary: Negative.   Musculoskeletal: Positive for arthralgias.  Allergic/Immunologic: Negative.    Neurological: Negative.   Hematological: Negative.   Psychiatric/Behavioral: Negative.     Allergies  Sulfa antibiotics; Sulfonamide derivatives; Amoxicillin; Amoxicillin-pot clavulanate; Cephalexin; Ciprofloxacin; Clarithromycin; Doxycycline; Erythromycin; Indomethacin; Iodine; Lidocaine; Penicillins; Xylocaine  [lidocaine hcl]; and Zithromax [azithromycin]  Home Medications   Prior to Admission medications   Medication Sig Start Date End Date Taking? Authorizing Provider  AUVI-Q 0.3 MG/0.3ML SOAJ injection Use as directed for life-threatening allergic reaction. 09/20/15   Kozlow, Alvira PhilipsEric J, MD  cetirizine (ZYRTEC) 10 MG tablet Take 10 mg by mouth daily.    [provider]  Cholecalciferol (VITAMIN D3) 5000 units CAPS Take 1 capsule by mouth daily.    [provider]  clotrimazole-betamethasone (LOTRISONE) cream  01/10/16   [provider]  Cyanocobalamin (VITAMIN B 12 PO) Take 1 tablet by mouth daily.    [provider]  cyclobenzaprine (FLEXERIL) 10 MG tablet Take 1 tablet (10 mg total) by mouth 2 (two) times daily as needed for muscle spasms. 11/08/16   Deatra Canterxford, Gari Trovato J, FNP  diazepam (VALIUM) 5 MG tablet  12/02/15   [provider]  diphenhydrAMINE (SOMINEX) 25 MG tablet Take 25 mg by mouth as needed for allergies.     [provider]  HYDROcodone-acetaminophen (NORCO/VICODIN) 5-325 MG tablet  12/02/15   [provider]  imipramine (TOFRANIL) 25 MG tablet TAKE THREE TABS AT BED-TIME  01/24/12   [provider]  imipramine (TOFRANIL-PM) 75 MG capsule Take 75 mg by mouth.    [provider]  KLOR-CON 10 10 MEQ tablet  11/30/15   [provider]  levothyroxine (SYNTHROID, LEVOTHROID) 100 MCG tablet Take 100 mcg by mouth daily.  04/20/09   [provider]  meloxicam (MOBIC) 7.5 MG tablet Take 1 tablet (7.5 mg total) by mouth 2 (two) times daily. 12/22/15   Enid BaasFields, Karl, MD  metaxalone [facility](SKELAXIN) 800 MG  tablet  12/21/15   [provider]  mupirocin ointment (BACTROBAN) 2 %  11/16/15   [provider]  NAPROXEN SODIUM PO Take 220 mg by mouth as needed.    [provider]  Olopatadine HCl 0.2 % SOLN  03/29/10   [provider]  SYNTHROID 88 MCG tablet  11/25/15   [provider]  tizanidine (ZANAFLEX) 2 MG capsule  12/02/15   [provider]  traMADol (ULTRAM) 50 MG tablet Take 1 tablet (50 mg total) by mouth every 12 (twelve) hours as needed. 01/19/16   Enid BaasFields, Karl, MD   Meds Ordered and Administered this Visit   Medications  acetaminophen (TYLENOL) tablet 975 mg (975 mg Oral Given 11/08/16 2007)  ketorolac (TORADOL) 30 MG/ML injection 30 mg (30 mg Intramuscular Given 11/08/16 2007)    BP 133/69 (BP Location: Left Arm)   Pulse 81   Temp 98.2 F (36.8 C) (Oral)   Resp 15   Ht 5\' 4"  (1.626 m)   Wt 155 lb (70.3 kg)   SpO2 100%   BMI 26.61 kg/m  No data found.   Physical Exam  Constitutional: She appears well-developed and well-nourished.  HENT:  Head: Normocephalic and atraumatic.  Eyes: Pupils are equal, round, and reactive to light. Conjunctivae and EOM are normal.  Neck: Normal range of motion. Neck supple.  Cardiovascular: Normal rate, regular rhythm and normal heart sounds.   Pulmonary/Chest: Effort normal and breath sounds normal.  Abdominal: Soft. Bowel sounds are normal.  Musculoskeletal: She exhibits tenderness.  TTP cervical spine paraspinous muscles with decreased ROM.  TTP right TMJ and right knee and left elbow  Nursing note and vitals reviewed.   Urgent Care Course     Procedures (including critical care time)  Labs Review Labs Reviewed - No data to display  Imaging Review Dg Cervical Spine Complete  Result Date: 11/08/2016 CLINICAL DATA:  Trip and fall with neck pain, initial encounter EXAM: CERVICAL SPINE - COMPLETE 4+ VIEW COMPARISON:  01/16/2016 FINDINGS: Postsurgical changes are again seen from C5-C7.  The overall appearance is stable. No acute fracture is identified. No significant neural foraminal changes are noted. IMPRESSION: Postsurgical changes without acute abnormality. Electronically Signed   By: Alcide CleverMark  Lukens M.D.   On: 11/08/2016 20:05     Visual Acuity Review  Right Eye Distance:   Left Eye Distance:   Bilateral Distance:    Right Eye Near:   Left Eye Near:    Bilateral Near:         MDM   1. Fall, initial encounter   2. Cervicalgia   3. Nonintractable episodic headache, unspecified headache type   4. 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    Deatra CanterOxford, Andrus Sharp J, [place]FNP 11/08/16 2036

## 2016-11-08 NOTE — ED Notes (Signed)
Patient transported to X-ray 

## 2016-11-09 DIAGNOSIS — M7041 Prepatellar bursitis, right knee: Secondary | ICD-10-CM | POA: Diagnosis not present

## 2016-11-09 DIAGNOSIS — S8001XA Contusion of right knee, initial encounter: Secondary | ICD-10-CM | POA: Diagnosis not present

## 2016-11-15 DIAGNOSIS — E7800 Pure hypercholesterolemia, unspecified: Secondary | ICD-10-CM | POA: Diagnosis not present

## 2016-11-15 DIAGNOSIS — E162 Hypoglycemia, unspecified: Secondary | ICD-10-CM | POA: Diagnosis not present

## 2016-11-15 DIAGNOSIS — Z0000 Encounter for general adult medical examination without abnormal findings: Secondary | ICD-10-CM | POA: Diagnosis not present

## 2016-11-20 DIAGNOSIS — Z0000 Encounter for general adult medical examination without abnormal findings: Secondary | ICD-10-CM | POA: Diagnosis not present

## 2016-11-20 DIAGNOSIS — E039 Hypothyroidism, unspecified: Secondary | ICD-10-CM | POA: Diagnosis not present

## 2016-11-20 DIAGNOSIS — E162 Hypoglycemia, unspecified: Secondary | ICD-10-CM | POA: Diagnosis not present

## 2016-11-21 ENCOUNTER — Other Ambulatory Visit: Payer: Self-pay | Admitting: Internal Medicine

## 2016-11-21 DIAGNOSIS — Z1231 Encounter for screening mammogram for malignant neoplasm of breast: Secondary | ICD-10-CM

## 2016-11-28 ENCOUNTER — Ambulatory Visit
Admission: RE | Admit: 2016-11-28 | Discharge: 2016-11-28 | Disposition: A | Discharge status: Home / Self Care | Payer: 59 | Source: Ambulatory Visit | Attending: Internal Medicine | Admitting: Internal Medicine

## 2016-11-28 DIAGNOSIS — Z1231 Encounter for screening mammogram for malignant neoplasm of breast: Secondary | ICD-10-CM

## 2016-12-07 DIAGNOSIS — G4733 Obstructive sleep apnea (adult) (pediatric): Secondary | ICD-10-CM | POA: Diagnosis not present

## 2016-12-20 ENCOUNTER — Encounter: Payer: Self-pay | Admitting: Neurology

## 2016-12-20 ENCOUNTER — Ambulatory Visit (INDEPENDENT_AMBULATORY_CARE_PROVIDER_SITE_OTHER): Payer: 59 | Admitting: Neurology

## 2016-12-20 VITALS — BP 130/78 | HR 92 | Ht 64.0 in | Wt 161.0 lb

## 2016-12-20 DIAGNOSIS — G478 Other sleep disorders: Secondary | ICD-10-CM | POA: Diagnosis not present

## 2016-12-20 DIAGNOSIS — R400 Somnolence: Secondary | ICD-10-CM

## 2016-12-20 DIAGNOSIS — G4710 Hypersomnia, unspecified: Secondary | ICD-10-CM
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
## 2016-12-20 NOTE — Patient Instructions (Signed)
I believe you may have an underlying sleepiness condition in addition to mild obstructive sleep apnea. This means, that you have a sleep disorder that manifests with excessive sleep and excessive sleepiness during the day.  We will do additional testing at this point. I would like for you to come back for an overnight sleep study during which you will use your CPAP (which we will set at 9 cm) and we will monitor your night time sleep and how well your sleep apnea is treated on the current settings. We will then do nap study testing the next day: 5 scheduled 20 min nap opportunities, every 2 hours. We will remind you to stay awake in between naps.   As explained, you will have to be off of any extra caffeine or stimulant or antidepressant medication in preparation for the sleep studies.  You cannot be on Zanaflex, Xanax, hydrocodone for at least 2 weeks prior to testing.  In addition, you have to taper off of the imipramine. Talk to Dr. Renne CriglerPharr about a safe way to taper and you have to be off of it completely for 2 weeks prior to testing.

## 2017-01-07 DIAGNOSIS — G4733 Obstructive sleep apnea (adult) (pediatric): Secondary | ICD-10-CM | POA: Diagnosis not present

## 2017-01-28 DIAGNOSIS — M2550 Pain in unspecified joint: Secondary | ICD-10-CM | POA: Diagnosis not present

## 2017-01-28 DIAGNOSIS — R002 Palpitations: Secondary | ICD-10-CM | POA: Diagnosis not present

## 2017-01-28 DIAGNOSIS — R413 Other amnesia: Secondary | ICD-10-CM | POA: Diagnosis not present

## 2017-02-01 DIAGNOSIS — R002 Palpitations: Secondary | ICD-10-CM | POA: Diagnosis not present

## 2017-02-06 DIAGNOSIS — R002 Palpitations: Secondary | ICD-10-CM | POA: Diagnosis not present

## 2017-02-06 DIAGNOSIS — G4733 Obstructive sleep apnea (adult) (pediatric): Secondary | ICD-10-CM | POA: Diagnosis not present

## 2017-02-06 DIAGNOSIS — Z23 Encounter for immunization: Secondary | ICD-10-CM | POA: Diagnosis not present

## 2017-02-11 DIAGNOSIS — M2550 Pain in unspecified joint: Secondary | ICD-10-CM | POA: Diagnosis not present

## 2017-02-18 DIAGNOSIS — H66011 Acute suppurative otitis media with spontaneous rupture of ear drum, right ear: Secondary | ICD-10-CM | POA: Diagnosis not present

## 2017-02-18 DIAGNOSIS — H722X1 Other marginal perforations of tympanic membrane, right ear: Secondary | ICD-10-CM | POA: Diagnosis not present

## 2017-03-01 DIAGNOSIS — G4733 Obstructive sleep apnea (adult) (pediatric): Secondary | ICD-10-CM | POA: Diagnosis not present

## 2017-03-03 ENCOUNTER — Ambulatory Visit (INDEPENDENT_AMBULATORY_CARE_PROVIDER_SITE_OTHER): Payer: 59 | Admitting: Neurology

## 2017-03-03 DIAGNOSIS — G4761 Periodic limb movement disorder: Secondary | ICD-10-CM

## 2017-03-03 DIAGNOSIS — G4710 Hypersomnia, unspecified: Secondary | ICD-10-CM

## 2017-03-03 DIAGNOSIS — G478 Other sleep disorders: Secondary | ICD-10-CM

## 2017-03-03 DIAGNOSIS — G4733 Obstructive sleep apnea (adult) (pediatric): Secondary | ICD-10-CM

## 2017-03-03 DIAGNOSIS — G4720 Circadian rhythm sleep disorder, unspecified type: Secondary | ICD-10-CM

## 2017-03-03 DIAGNOSIS — R400 Somnolence: Secondary | ICD-10-CM

## 2017-03-04 ENCOUNTER — Encounter (INDEPENDENT_AMBULATORY_CARE_PROVIDER_SITE_OTHER): Payer: 59 | Admitting: Neurology

## 2017-03-04 DIAGNOSIS — R400 Somnolence: Secondary | ICD-10-CM

## 2017-03-04 DIAGNOSIS — G478 Other sleep disorders: Secondary | ICD-10-CM

## 2017-03-04 DIAGNOSIS — G4710 Hypersomnia, unspecified: Secondary | ICD-10-CM

## 2017-03-04 DIAGNOSIS — G4753 Recurrent isolated sleep paralysis: Secondary | ICD-10-CM | POA: Diagnosis not present

## 2017-03-06 ENCOUNTER — Telehealth: Payer: Self-pay

## 2017-03-06 NOTE — Telephone Encounter (Signed)
I called pt. I advised her that her cpap titration study shows that she is adequately treated with cpap and pt should continue using her cpa pat home. During the nap study, pt had mild sleepiness, but this does not keep with narcolepsy or idiopathic hypersomnolence. Dr. Frances FurbishAthar wants to go over the results of the study in a follow up appt. Pt says that this is "not good news". An appt was made for 05/02/17 at 11:30 am but it is asking to be called if a sooner appt becomes available. Pt verbalized understanding of results. Pt had no questions at this time but was encouraged to call back if questions arise. Pt has been placed on the wait list.

## 2017-03-06 NOTE — Telephone Encounter (Signed)
Pt has been scheduled for 03/13/17 at 8:00am.

## 2017-03-06 NOTE — Telephone Encounter (Signed)
Pt has called back in response to RN Kristen's call to her while she was at work.  Pt is asking that [facility] MastRn Kristen calls her so they can speak in greater detail re: results that RN Baxter HireKristen called to go over with her

## 2017-03-06 NOTE — Telephone Encounter (Signed)
Pt returned RN's call. She accepted 11/28 @ 8.

## 2017-03-06 NOTE — Procedures (Signed)
Name:  Ashley Mathews, Ashley Mathews Reference [id number]  Study Date: 03/04/2017 Procedure #: [id number]  DOB: 02/04/1961    Protocol  This is a 13 channel Multiple Sleep Latency Test comprised of 5 channels of EEG (T3-Cz, Cz-T4, F4-M1, C4-M1, O2-M1), 3 channels of Chin EMG, 4 channels of EOG and 1 channel for ECG.   All channels were sampled at 256hz .    This polysomnographic procedure is designed to evaluate (1) the complaint of excessive daytime sleepiness by quantifying the time required to fall asleep and (2) the possibility of narcolepsy by checking for abnormally short latencies to REM sleep.  Electrographic variables include EEG, EMG, EOG and ECG.  Patients are monitored throughout four or five 20-minute opportunities to sleep (naps) at two-hour intervals.  For each nap, the patient is allowed 20 minutes to fall asleep.  Once asleep, the patient is awakened after 15 minutes.  Between naps, the patient is kept as alert as possible.  A sleep latency of 20 minutes indicates that no sleep occurred.  Parametric Analysis  Total Number of Naps 5     NAP # Time of Nap  Sleep Latency (mins) REM Latency (mins) Sleep Time Percent Awake Time Percent  1 07:11 8 0 38 63   2 09:02 9 0 48  52   3 11:03 9 0 42  58   4 13:10 20 0 0  100   5 15:04 11.5 1.5 55  45    MSLT Summary of Naps  Sleepiness Index: 42.5  Mean Sleep Latency to all Five Naps: 11.5  Mean Sleep Latency to First Four Naps: 11.5  Mean Sleep Latency to First Three Naps: 8.7  Mean Sleep Latency to First Two Naps: 8.5  Number of Naps with REM Sleep: 1    Results from Preceding PSG Study  Sleep Onset Time 22:04 Sleep Efficiency (%) 87.3  Rise Time 05:17 Sleep Latency (min) 45  Total Sleep Time  417.5  REM Latency (min) 119    I attest to having reviewed every epoch of the entire raw data recording prior to the issuance of this report in accordance with the Standards of the American Academy of Sleep Medicine.     DIAGNOSIS 1. Obstructive Sleep Apnea  2. Periodic limb movement disorder 3. Dysfunctions associated with sleep stages or arousal from sleep  4. Hypersomnia, unspecified  PLANS/RECOMMENDATIONS:  1. The night time sleep study demonstrates adequately treated OSA with her home CPAP pressure of 9 cm. I will ask the patient to continue home CPAP therapy.  2. Mild PLMs (periodic limb movements of sleep) were noted during the night time sleep study with no arousals; clinical correlation is recommended.  3. This MSLT does not show a pathological degree of daytime sleepiness and is not in keeping with findings seen with narcolepsy or idiopathic hypersomnolence. Nevertheless, mild, non-specific sleepiness was noted.  4. The patient should be cautioned not to drive, work at heights, or operate dangerous or heavy equipment when tired or sleepy. Review and reiteration of good sleep hygiene measures should be pursued with any patient. 5. The patient [facility] the test results and further management strategies. The referring provider will be notified of the test results.  I certify that I have reviewed the entire raw data recording prior to the issuance of this report in accordance with the Standards of Accreditation of the American Academy of Sleep Medicine (AASM)   Huston FoleySaima Krystal Teachey, MD, PHD  Diplomat, Biomedical engineerAmerican Board of Psychiatry and Neurology (Neurology and Sleep Medicine)

## 2017-03-06 NOTE — Progress Notes (Signed)
Patient referred by Dr. Renne CriglerPharr, seen by me on 12/20/16, night time cpap study on 03/03/17 and MSLT on 03/04/17.   Please call and notify the patient that the recent sleep studies showed adequately treated OSA with her CPAP. I would like for her to continue with her PAP at home as she has been. She had during her nap study mild sleepiness, but not in keeping with narcolepsy or idiopathic hypersomnolence. I would like to go over the details of the study during a follow up appointment. Arrange a followup appointment. Also, route or fax report to PCP and referring MD, if other than PCP.  Once you have spoken to patient, you can close this encounter.   Thanks,  Huston FoleySaima Dorina Ribaudo, MD, PhD [facility])

## 2017-03-06 NOTE — Telephone Encounter (Signed)
We can offer her an 8 AM appt sometime soon.

## 2017-03-06 NOTE — Telephone Encounter (Signed)
I called pt to discuss. No answer, left her a message asking her to call me back. If pt calls back, will you please ask if she is available to come in for an appt with Dr. Frances FurbishAthar either Tuesday 03/12/17 or 03/13/17 at 8:00am?
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
## 2017-03-06 NOTE — Telephone Encounter (Signed)
-----   Message from Huston FoleySaima Athar, MD sent at 03/06/2017  8:31 AM EST ----- Patient referred by Dr. Renne CriglerPharr, seen by me on 12/20/16, night time cpap study on 03/03/17 and MSLT on 03/04/17.   Please call and notify the patient that the recent sleep studies showed adequately treated OSA with her CPAP. I would like for her to continue with her PAP at home as she has been. She had during her nap study mild sleepiness, but not in keeping with narcolepsy or idiopathic hypersomnolence. I would like to go over the details of the study during a follow up appointment. Arrange a followup appointment. Also, route or fax report to PCP and referring MD, if other than PCP.  Once you have spoken to patient, you can close this encounter.   Thanks,  Huston FoleySaima Athar, MD, PhD [facility])

## 2017-03-06 NOTE — Procedures (Signed)
PATIENT'S NAME:  Ashley Mathews, Ashley Mathews DOB:      08/21/1960      MR#:    [id number]     DATE OF RECORDING: 03/03/2017 REFERRING M.D.:  Merri BrunetteWalter Pharr MD Study Performed:   CPAP  Titration HISTORY: 56 year old woman with a history of thyroid disease, allergic rhinitis, not disorder, fibromyalgia, hyperlipidemia, and overweight state, who reports excessive daytime somnolence, despite being on AutoPap therapy. The patient endorsed the Epworth Sleepiness Scale at 20 points. The patient's weight 161 pounds with a height of 64 (inches), resulting in a BMI of 27.5 kg/m2.   CURRENT MEDICATIONS: Xanax,  Auvi-Q pen, Zyrtec, Vitamin D, Lotrisone cream, Benadryl, Vicodin, Tofranil, Klor-con, Levothroid, Skalaxin, Naproxen, Clopatadine, Synthroid, Zanaflex, Ultram  PROCEDURE:  This is a multichannel digital polysomnogram utilizing the SomnoStar 11.2 system.  Electrodes and sensors were applied and monitored per AASM Specifications.   EEG, EOG, Chin and Limb EMG, were sampled at 200 Hz.  ECG, Snore and Nasal Pressure, Thermal Airflow, Respiratory Effort, CPAP Flow and Pressure, Oximetry was sampled at 50 Hz. Digital video and audio were recorded.      The patient reported she tapered off the required medications prior to sleep study testing. The patient presented for a nighttime sleep study during which she was kept on her home CPAP pressure of 9 cm. She then had a next day MSLT. Her AHI was 0.4/hour on that pressure with non-supine REM sleep achieved and O2 nadir of 95%. She used her typical medium nasal pillows interface.   Lights Out was at 21:19 and Lights On at 05:17. Total recording time (TRT) was 478.5 minutes, with a total sleep time (TST) of 417.5 minutes. The patient's sleep latency was 45 minutes, which was delayed. REM latency was 119 minutes, which was borderline delayed. The sleep efficiency was 87.3 %.    SLEEP ARCHITECTURE: WASO (Wake after sleep onset)  was 15.5 minutes with mild sleep fragmentation noted.  There were 66.5 minutes in Stage N1, 186 minutes Stage N2, 76 minutes Stage N3 and 89 minutes in Stage REM.  The percentage of Stage N1 was 15.9%, which is increased, Stage N2 was 44.6%, which is on the lower end of normal. Stage N3 was 18.2%, which is normal, and Stage R (REM sleep) was 21.3%, which is norma. The arousals were noted as: 8 were spontaneous, 0 were associated with PLMs, 5 were associated with respiratory events.  Audio and video analysis did not show any abnormal or unusual movements, behaviors, phonations or vocalizations. The patient took no bathroom breaks. No significant snoring was noted. The EKG was in keeping with normal sinus rhythm (NSR).  RESPIRATORY ANALYSIS:  There was a total of 12 respiratory events: 0 obstructive apneas, 0 central apneas and 0 mixed apneas with a total of 0 apneas and an apnea index (AI) of 0 /hour. There were 12 hypopneas with a hypopnea index of 1.7/hour. The patient also had 0 respiratory event related arousals (RERAs).      The total APNEA/HYPOPNEA INDEX  (AHI) was 1.7 /hour and the total RESPIRATORY DISTURBANCE INDEX was 1.7 .hour  3 events occurred in REM sleep and 9 events in NREM. The REM AHI was 2. /hour versus a non-REM AHI of 1.6 /hour.  The patient spent 0 minutes of total sleep time in the supine position and 418 minutes in non-supine. The supine AHI was 0.0, versus a non-supine AHI of 1.7.  OXYGEN SATURATION & C02:  The baseline 02 saturation was 97%, with the lowest being  93%. Time spent below 89% saturation equaled 0 minutes.  PERIODIC LIMB MOVEMENTS: The patient had a total of 132 Periodic Limb Movements. The Periodic Limb Movement (PLM) index was 19. and the PLM Arousal index was 0 /hour.  Post-study, the patient indicated that sleep was the same as usual.   The next day MSLT (nap study) on 03/04/17 showed a mean sleep latency of 11.5 for 5 naps. She did not achieve sleep in nap #4. She achieved REM sleep in nap #5. These results  indicate a mild degree of sleepiness, not in keeping with narcolepsy or idiopathic hypersomnolence.   DIAGNOSIS 1. Obstructive Sleep Apnea  2. Periodic limb movement disorder 3. Dysfunctions associated with sleep stages or arousal from sleep  4. Hypersomnia, unspecified  PLANS/RECOMMENDATIONS:  1. This night time sleep study demonstrates adequately treated OSA with her home CPAP pressure of 9 cm. I will ask the patient to continue home CPAP therapy.  2. Mild PLMs (periodic limb movements of sleep) were noted during the night time sleep study with no arousals; clinical correlation is recommended.  3. The next day MSLT does not show a pathological degree of daytime sleepiness and is not in keeping with findings seen with narcolepsy or idiopathic hypersomnolence. Nevertheless, mild, non-specific sleepiness was noted.  4. The patient should be cautioned not to drive, work at heights, or operate dangerous or heavy equipment when tired or sleepy. Review and reiteration of good sleep hygiene measures should be pursued with any patient. 5. The patient will be seen in follow-up by [facility] for discussion of the test results and further management strategies. The referring provider will be notified of the test results.  I certify that I have reviewed the entire raw data recording prior to the issuance of this report in accordance with the Standards of Accreditation of the American Academy of Sleep Medicine (AASM)   Huston FoleySaima Eliabeth Shoff, MD, PHD Diplomat, American Board of Psychiatry and Neurology (Neurology and Sleep Medicine)

## 2017-03-09 DIAGNOSIS — G4733 Obstructive sleep apnea (adult) (pediatric): Secondary | ICD-10-CM | POA: Diagnosis not present

## 2017-03-11 DIAGNOSIS — Z1159 Encounter for screening for other viral diseases: Secondary | ICD-10-CM | POA: Diagnosis not present

## 2017-03-11 DIAGNOSIS — R002 Palpitations: Secondary | ICD-10-CM | POA: Diagnosis not present

## 2017-03-13 ENCOUNTER — Ambulatory Visit: Payer: 59 | Admitting: Neurology

## 2017-03-13 ENCOUNTER — Telehealth: Payer: Self-pay

## 2017-03-13 ENCOUNTER — Encounter: Payer: Self-pay | Admitting: Neurology

## 2017-03-13 VITALS — BP 119/72 | HR 75 | Ht 64.0 in | Wt 167.0 lb

## 2017-03-13 DIAGNOSIS — R1011 Right upper quadrant pain: Secondary | ICD-10-CM | POA: Diagnosis not present

## 2017-03-13 DIAGNOSIS — G4733 Obstructive sleep apnea (adult) (pediatric): Secondary | ICD-10-CM

## 2017-03-13 DIAGNOSIS — E039 Hypothyroidism, unspecified: Secondary | ICD-10-CM | POA: Diagnosis not present

## 2017-03-13 DIAGNOSIS — G4710 Hypersomnia, unspecified: Secondary | ICD-10-CM | POA: Diagnosis not present

## 2017-03-13 DIAGNOSIS — Z9989 Dependence on other enabling machines and devices: Secondary | ICD-10-CM

## 2017-03-13 DIAGNOSIS — G4730 Sleep apnea, unspecified: Secondary | ICD-10-CM | POA: Diagnosis not present

## 2017-03-13 DIAGNOSIS — R002 Palpitations: Secondary | ICD-10-CM | POA: Diagnosis not present

## 2017-03-13 MED ORDER — MODAFINIL 100 MG PO TABS: 100.0000 mg | ORAL_TABLET | Freq: Two times a day (BID) | ORAL | 5 refills | Status: DC

## 2017-03-13 MED ORDER — MODAFINIL 200 MG PO TABS
200.0000 mg | ORAL_TABLET | Freq: Every day | ORAL | 5 refills | Status: DC
Start: 2017-03-13 — End: 2017-09-16

## 2017-03-13 MED ORDER — MODAFINIL 200 MG PO TABS: 100.0000 mg | ORAL_TABLET | Freq: Two times a day (BID) | ORAL | 5 refills | Status: DC
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
## 2017-03-13 NOTE — Patient Instructions (Addendum)
Your sleep study at night looked benign and you are well treated with the CPAP. Please continue with the CPAP, try a different mask which we may be able to give you. Also, I will reduce your CPAP pressure to 8 cm for less pressure, ramp time of 30 min.   Your daytime sleep test indicated sleepiness, not bad enough to diagnose narcolepsy, or idiopathic hypersomnolence. Nevertheless, you are sleepy during the day.   We will try for your sleepiness, Provigil 100 mg: 1 pill up to 2 times a day as needed. Avoid after 3 PM. Side effects include (but are not limited to): high blood pressure, headache, nervousness, palpitations, GI upset, tremor.

## 2017-03-13 NOTE — Telephone Encounter (Signed)
Patient came by for a mask fitting due to she cannot wear her nasal pliiows because of sinus infection. Fitted her with a  dreamwear full face mask. Instructed  her how to wear it and explained that she could open her mouth with this mask. Gave her one to take home.

## 2017-03-13 NOTE — Telephone Encounter (Signed)
I received notice from [facility] that pt's modafinil 100 mg BID RX will need a PA for BID dosing. I called [facility]. If the RX is changed to modafinil 200mg  daily, no PA is required, and the copay would be $50, per Corrie DandyMary at [facility]. Dr. Frances FurbishAthar is agreeable to this and recommends that pt take modafinil 200mg  daily, 1/2 tablet in the morning and 1/2 tablet in the evening. I called pt and explained this her. She is agreeable to taking the modafinil 200mg  tablet 1/2 tablet in morning and 1/2 tablet in the evening. Will send updated RX to [facility].

## 2017-03-14 DIAGNOSIS — M2550 Pain in unspecified joint: Secondary | ICD-10-CM | POA: Diagnosis not present

## 2017-03-14 DIAGNOSIS — M797 Fibromyalgia: Secondary | ICD-10-CM | POA: Diagnosis not present

## 2017-03-14 DIAGNOSIS — R768 Other specified abnormal immunological findings in serum: Secondary | ICD-10-CM | POA: Diagnosis not present

## 2017-04-03 DIAGNOSIS — R002 Palpitations: Secondary | ICD-10-CM | POA: Diagnosis not present

## 2017-04-03 DIAGNOSIS — I493 Ventricular premature depolarization: Secondary | ICD-10-CM | POA: Diagnosis not present

## 2017-04-03 DIAGNOSIS — E7800 Pure hypercholesterolemia, unspecified: Secondary | ICD-10-CM | POA: Diagnosis not present

## 2017-04-04 ENCOUNTER — Encounter: Payer: Self-pay | Admitting: Sports Medicine

## 2017-04-04 ENCOUNTER — Ambulatory Visit: Payer: 59 | Admitting: Sports Medicine

## 2017-04-04 DIAGNOSIS — M25562 Pain in left knee: Secondary | ICD-10-CM

## 2017-04-04 DIAGNOSIS — G8929 Other chronic pain: Secondary | ICD-10-CM | POA: Diagnosis not present

## 2017-04-04 DIAGNOSIS — M25561 Pain in right knee: Secondary | ICD-10-CM | POA: Diagnosis not present

## 2017-04-04 NOTE — Progress Notes (Signed)
   HPI  CC: Bilateral knee pain follow up   56 yo woman with PMH of fibromyalgia and suspected autoimmune disorder here for chronic knee pain follow up. Last seen 01/19/16 at which time she was found to have positive ANA but otherwise negative work up. During the intervening time she has been following with rheumatology Dr Riley [facility]ryal [facility] who in addition to the aforementioned otherwise negative work up had done a bedside ultrasound of patient hands that showed inflammation and therefore recommended starting plaquenil. Patient states she did not start plaquenil because she was diagnosed with idiopathic hypersomia and started on provigil at that time and she was hesitant to start multiple medications.  She also had a fall on 11/08/16 with impact on R knee cap that had acutely caused swelling and redness thought to be bursitis given she had negative xrays. Her knee swelling has improved over the last few months but she feels it has remained puffy and lateral knee at point of the impact is still tender to any touch or pressure.  See HPI and/or previous note for associated ROS.  Mother has CREST syndrome  Objective: BP 122/82   Ht 5\' 4"  (1.626 m)   Wt 161 lb (73 kg)   BMI 27.64 kg/m  Gen: NAD, well groomed, a/o x3, normal affect.  CV: Well-perfused. Warm.  Resp: Non-labored.  Neuro: Sensation intact throughout. No gross coordination deficits.  MSK: R knee TTP over lateral aspect just inferior to patella, slight edema without effusion with erythema. Bilateral knees with full ROM. Neg McMurray. Hands are significant for edematous MCPs and PIPs without swan neck deformity.  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 She is healing from a bursitis that see sustained from a fall in July with some residual tenderness. Given she is currently hesitant to start plaquenil without a clear diagnosis, discussed topical  options. She is unfortunately allergic to tumeric so will trial arnica gel/cream with wrapping/massage and heat/ice.    Leland HerElsia J Yoo, DO PGY-2, [facility] 04/04/2017 4:05 PM

## 2017-04-04 NOTE — Assessment & Plan Note (Signed)
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 She is healing from a bursitis that see sustained from a fall in July with some residual tenderness. Given she is currently hesitant to start plaquenil without a clear diagnosis, discussed topical options. She is unfortunately allergic to tumeric so will trial arnica gel/cream with wrapping/massage and heat/ice.

## 2017-04-04 NOTE — Patient Instructions (Signed)
Try Arnica cream/gel, rub into R knee three times a day.     Wrap twice a day. Try application of either heat or ice, heat first since likely will help the autoimmune part the most.

## 2017-04-08 DIAGNOSIS — G4733 Obstructive sleep apnea (adult) (pediatric): Secondary | ICD-10-CM | POA: Diagnosis not present

## 2017-04-18 DIAGNOSIS — I493 Ventricular premature depolarization: Secondary | ICD-10-CM | POA: Diagnosis not present

## 2017-04-18 DIAGNOSIS — R002 Palpitations: Secondary | ICD-10-CM | POA: Diagnosis not present

## 2017-04-18 DIAGNOSIS — R5382 Chronic fatigue, unspecified: Secondary | ICD-10-CM | POA: Diagnosis not present

## 2017-04-23 DIAGNOSIS — R490 Dysphonia: Secondary | ICD-10-CM | POA: Diagnosis not present

## 2017-04-23 DIAGNOSIS — J029 Acute pharyngitis, unspecified: Secondary | ICD-10-CM | POA: Diagnosis not present

## 2017-04-26 DIAGNOSIS — R002 Palpitations: Secondary | ICD-10-CM | POA: Diagnosis not present

## 2017-04-26 DIAGNOSIS — R5383 Other fatigue: Secondary | ICD-10-CM | POA: Diagnosis not present

## 2017-04-26 DIAGNOSIS — I493 Ventricular premature depolarization: Secondary | ICD-10-CM | POA: Diagnosis not present

## 2017-05-01 ENCOUNTER — Telehealth: Payer: Self-pay

## 2017-05-01 NOTE — Telephone Encounter (Signed)
I called pt, no answer, left a message asking her to call me back.  I don't think the pt needs the appt for tomorrow, she has already followed up on her sleep study results and has an appt next month.

## 2017-05-02 ENCOUNTER — Encounter: Payer: Self-pay | Admitting: Neurology

## 2017-05-02 ENCOUNTER — Ambulatory Visit: Payer: 59 | Admitting: Neurology

## 2017-05-02 VITALS — BP 134/71 | HR 87 | Ht 64.0 in | Wt 161.0 lb

## 2017-05-02 DIAGNOSIS — Z789 Other specified health status: Secondary | ICD-10-CM | POA: Diagnosis not present

## 2017-05-02 DIAGNOSIS — R413 Other amnesia: Secondary | ICD-10-CM | POA: Diagnosis not present

## 2017-05-02 DIAGNOSIS — R51 Headache: Secondary | ICD-10-CM | POA: Diagnosis not present

## 2017-05-02 DIAGNOSIS — G4710 Hypersomnia, unspecified: Secondary | ICD-10-CM

## 2017-05-02 DIAGNOSIS — G4730 Sleep apnea, unspecified: Secondary | ICD-10-CM

## 2017-05-02 DIAGNOSIS — G4733 Obstructive sleep apnea (adult) (pediatric): Secondary | ICD-10-CM

## 2017-05-02 DIAGNOSIS — R400 Somnolence: Secondary | ICD-10-CM | POA: Diagnosis not present

## 2017-05-02 DIAGNOSIS — Z9989 Dependence on other enabling machines and devices: Secondary | ICD-10-CM | POA: Diagnosis not present

## 2017-05-02 DIAGNOSIS — R419 Unspecified symptoms and signs involving cognitive functions and awareness: Secondary | ICD-10-CM

## 2017-05-02 DIAGNOSIS — R519 Headache, unspecified: Secondary | ICD-10-CM
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
## 2017-05-02 NOTE — Patient Instructions (Addendum)
I will provide a letter of support for you, outlining your sleep issues.  Your provigil is unlikely to cause your hoarseness, I have never had anyone have voice issues with Provigil.  Please work closely with your allergy specialist.  I appreciate you trying CPAP and being compliant with it, but you have had recurrent sinus issues, a history of significant allergies, and sore throat. Therefore, you have struggled with CPAP for some weeks. Let's look into getting you an oral appliance. I would like to make a referral to Dr. Toni ArthursFuller at this time for you to be considered for a custom-made oral appliance for treatment of obstructive sleep apnea. We will send my records, and your sleep study results to her office and you should hear from her office directly. I will see you back in about 3 months.  You have had issues with recurrent headaches and some cognitive issues, concentration problems, difficulty remembering things recently. We will go ahead and do a brain scan, called MRI and call you with the test results. We will have to schedule you for this on a separate date. This test requires authorization from your insurance, and we will take care of the insurance process. In addition, we may consider a formal neuropsychological test (aka cognitive testing) for your memory/cognitive complaints. This requires a referral to a trained and licensed neuropsychologist and will be a separate appointment at a different clinic.

## 2017-05-07 ENCOUNTER — Encounter: Payer: Self-pay | Admitting: Internal Medicine

## 2017-05-09 DIAGNOSIS — J019 Acute sinusitis, unspecified: Secondary | ICD-10-CM | POA: Diagnosis not present

## 2017-05-09 DIAGNOSIS — R05 Cough: Secondary | ICD-10-CM | POA: Diagnosis not present

## 2017-05-09 DIAGNOSIS — R490 Dysphonia: Secondary | ICD-10-CM | POA: Diagnosis not present

## 2017-05-09 DIAGNOSIS — G4733 Obstructive sleep apnea (adult) (pediatric): Secondary | ICD-10-CM | POA: Diagnosis not present

## 2017-05-13 ENCOUNTER — Telehealth: Payer: Self-pay | Admitting: Neurology

## 2017-05-13 MED ORDER — ALPRAZOLAM 0.5 MG PO TABS: ORAL_TABLET | ORAL | 0 refills | Status: DC

## 2017-05-13 NOTE — Telephone Encounter (Signed)
Pt already has a xanax RX listed on her current meds. I called pt to discuss. No answer, left a message asking her to call me back.

## 2017-05-13 NOTE — Telephone Encounter (Signed)
Patient is schedule [facility] She informed me she is claustrophobic and will need something to calm her nerves.

## 2017-05-13 NOTE — Telephone Encounter (Signed)
Pt returned my call, she reports that she has not had any xanax since May. She only took that for a short time to get used to her cpap machine and does not have any xanax available at this time.

## 2017-05-13 NOTE — Telephone Encounter (Signed)
I have ordered Xanax for patient's upcoming MRI due to anxiety/claustrophobia reported. Please inform patient that she should not drive after taking Xanax and have someone take her for the MRI appointment.

## 2017-05-13 NOTE — Telephone Encounter (Signed)
I called pt, explained that Dr. Frances FurbishAthar is agreeable to prescribing her xanax as long as pt has a driver.  Pt says that she already has a driver lined up. Pt asked that the RX be faxed to [facility]. Received a receipt of confirmation.

## 2017-05-15 ENCOUNTER — Ambulatory Visit: Payer: 59

## 2017-05-15 DIAGNOSIS — R413 Other amnesia: Secondary | ICD-10-CM

## 2017-05-15 DIAGNOSIS — G4710 Hypersomnia, unspecified: Secondary | ICD-10-CM

## 2017-05-15 DIAGNOSIS — Z9989 Dependence on other enabling machines and devices: Secondary | ICD-10-CM | POA: Diagnosis not present

## 2017-05-15 DIAGNOSIS — R519 Headache, unspecified: Secondary | ICD-10-CM

## 2017-05-15 DIAGNOSIS — Z789 Other specified health status: Secondary | ICD-10-CM | POA: Diagnosis not present

## 2017-05-15 DIAGNOSIS — R419 Unspecified symptoms and signs involving cognitive functions and awareness: Secondary | ICD-10-CM

## 2017-05-15 DIAGNOSIS — G4733 Obstructive sleep apnea (adult) (pediatric): Secondary | ICD-10-CM

## 2017-05-15 DIAGNOSIS — G4730 Sleep apnea, unspecified: Secondary | ICD-10-CM

## 2017-05-15 DIAGNOSIS — R400 Somnolence: Secondary | ICD-10-CM | POA: Diagnosis not present

## 2017-05-15 DIAGNOSIS — R51 Headache: Secondary | ICD-10-CM | POA: Diagnosis not present

## 2017-05-15 MED ORDER — GADOPENTETATE DIMEGLUMINE 469.01 MG/ML IV SOLN: 15.0000 mL | Freq: Once | INTRAVENOUS | Status: AC | PRN

## 2017-05-16 NOTE — Progress Notes (Signed)
MRI brain was reported to be unremarkable with and without contrast. Please notify patient. Huston FoleySaima Nolia Tschantz, MD, PhD [facility]

## 2017-05-20 ENCOUNTER — Telehealth: Payer: Self-pay

## 2017-05-20 NOTE — Telephone Encounter (Signed)
I called pt, advised her that her MRI brain was unremarkable. Pt verbalized understanding of results. Pt had no questions at this time but was encouraged to call back if questions arise.

## 2017-05-20 NOTE — Telephone Encounter (Signed)
-----   Message from Huston FoleySaima Athar, MD sent at 05/16/2017  5:07 PM EST ----- MRI brain was reported to be unremarkable with and without contrast. Please notify patient. Huston FoleySaima Athar, MD, PhD [facility])

## 2017-06-03 DIAGNOSIS — E039 Hypothyroidism, unspecified: Secondary | ICD-10-CM | POA: Diagnosis not present

## 2017-06-03 DIAGNOSIS — R002 Palpitations: Secondary | ICD-10-CM | POA: Diagnosis not present

## 2017-06-03 DIAGNOSIS — R1011 Right upper quadrant pain: Secondary | ICD-10-CM | POA: Diagnosis not present

## 2017-06-09 DIAGNOSIS — G4733 Obstructive sleep apnea (adult) (pediatric): Secondary | ICD-10-CM | POA: Diagnosis not present

## 2017-06-10 ENCOUNTER — Telehealth: Payer: Self-pay

## 2017-06-10 NOTE — Telephone Encounter (Signed)
I completed pa for modafinil, sent to [facility]. Should have a determination in 1-3 business days.

## 2017-06-10 NOTE — Telephone Encounter (Signed)
PA for modafinil was approved through 06/10/2018 by [facility]. [id number].

## 2017-06-12 ENCOUNTER — Telehealth: Payer: Self-pay | Admitting: Neurology

## 2017-06-12 NOTE — Telephone Encounter (Signed)
Dr. Alveda ReasonsFuller's office has called there patient's ser val  times patient's voice mail was full. Morrie Sheldonshley has reached out to patient again. Morrie Sheldonshley has even reached out to patient via e-mail.

## 2017-06-12 NOTE — Telephone Encounter (Signed)
Called patient to r/s CPAP follow-up due to Dr. Frances FurbishAthar being out of the office. Patient states she has been having trouble with her CPAP machine. She states it is making her sick. Talked with RN, KD and she gave the OK to get patient in sooner with an NP.

## 2017-06-12 NOTE — Telephone Encounter (Signed)
error 

## 2017-06-13 ENCOUNTER — Ambulatory Visit: Payer: 59 | Admitting: Neurology

## 2017-06-13 NOTE — Telephone Encounter (Signed)
Ashley Mathews, can you call this pt and give her Dr. Alveda Mathews's number to call?  It appears pt has scheduled an appt with Eber Jonesarolyn, NP for 06/14/17 because the cpap is making her feel sick. I believe that Dr. Frances FurbishAthar has recommended pt try an oral appliance, instead of a cpap at this point because of her poor tolerance of cpap. Pt may not need her appt on 06/14/17 with Eber Jonesarolyn, NP to discuss her cpap, if she is not required to use her cpap at this time.
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
## 2017-06-13 NOTE — Telephone Encounter (Signed)
Pt needs to see Dr. Toni ArthursFuller for her oral appliance. Pt should keep her FU appt with CM for her hypersomnolence, and as she is on modafinil.

## 2017-06-13 NOTE — Telephone Encounter (Signed)
I called her on 06/13/2017 and left her a message.  Dr. Toni ArthursFuller office  Telephone [phone]

## 2017-06-14 ENCOUNTER — Ambulatory Visit: Payer: 59 | Admitting: Nurse Practitioner

## 2017-06-14 ENCOUNTER — Encounter: Payer: Self-pay | Admitting: Nurse Practitioner

## 2017-06-14 DIAGNOSIS — G4710 Hypersomnia, unspecified: Secondary | ICD-10-CM

## 2017-06-14 DIAGNOSIS — G4733 Obstructive sleep apnea (adult) (pediatric): Secondary | ICD-10-CM | POA: Diagnosis not present

## 2017-06-14 NOTE — Patient Instructions (Signed)
Modafinil 200 mg tablets, one half around 730-1/2 around 130 daily Check with your equipment company to make sure pressures are correct on CPAP if you want to go back to that Appointment with Dr. Toni ArthursFuller for oral appliance Follow-up in 3 months

## 2017-07-07 DIAGNOSIS — G4733 Obstructive sleep apnea (adult) (pediatric): Secondary | ICD-10-CM | POA: Diagnosis not present

## 2017-07-31 ENCOUNTER — Ambulatory Visit: Payer: 59 | Admitting: Neurology

## 2017-08-07 DIAGNOSIS — G4733 Obstructive sleep apnea (adult) (pediatric): Secondary | ICD-10-CM | POA: Diagnosis not present

## 2017-08-21 DIAGNOSIS — G4733 Obstructive sleep apnea (adult) (pediatric): Secondary | ICD-10-CM | POA: Diagnosis not present
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
## 2017-09-16 ENCOUNTER — Ambulatory Visit: Payer: 59 | Admitting: Nurse Practitioner

## 2017-09-16 ENCOUNTER — Encounter: Payer: Self-pay | Admitting: Nurse Practitioner

## 2017-09-16 VITALS — BP 124/77 | HR 84 | Ht 64.0 in | Wt 165.8 lb

## 2017-09-16 DIAGNOSIS — G4710 Hypersomnia, unspecified: Secondary | ICD-10-CM | POA: Diagnosis not present

## 2017-09-16 DIAGNOSIS — G4733 Obstructive sleep apnea (adult) (pediatric): Secondary | ICD-10-CM | POA: Diagnosis not present

## 2017-09-16 MED ORDER — MODAFINIL 200 MG PO TABS: 200.0000 mg | ORAL_TABLET | Freq: Every day | ORAL | 5 refills | Status: DC

## 2017-09-16 NOTE — Patient Instructions (Signed)
Modafinil 200mg  1/2 in the am and 1/2 prior to 2pm. Continue  oral appliance Follow-up in 6 months

## 2017-10-03 DIAGNOSIS — E039 Hypothyroidism, unspecified: Secondary | ICD-10-CM | POA: Diagnosis not present

## 2017-10-03 DIAGNOSIS — E162 Hypoglycemia, unspecified: Secondary | ICD-10-CM | POA: Diagnosis not present

## 2017-10-10 DIAGNOSIS — E039 Hypothyroidism, unspecified: Secondary | ICD-10-CM | POA: Diagnosis not present

## 2017-11-14 DIAGNOSIS — Z0000 Encounter for general adult medical examination without abnormal findings: Secondary | ICD-10-CM | POA: Diagnosis not present

## 2017-11-14 DIAGNOSIS — E039 Hypothyroidism, unspecified: Secondary | ICD-10-CM | POA: Diagnosis not present

## 2017-11-21 DIAGNOSIS — E785 Hyperlipidemia, unspecified: Secondary | ICD-10-CM | POA: Diagnosis not present

## 2017-11-21 DIAGNOSIS — R7989 Other specified abnormal findings of blood chemistry: Secondary | ICD-10-CM | POA: Diagnosis not present

## 2017-11-21 DIAGNOSIS — Z0000 Encounter for general adult medical examination without abnormal findings: Secondary | ICD-10-CM | POA: Diagnosis not present

## 2017-12-17 DIAGNOSIS — L719 Rosacea, unspecified: Secondary | ICD-10-CM | POA: Diagnosis not present

## 2017-12-17 DIAGNOSIS — L280 Lichen simplex chronicus: Secondary | ICD-10-CM | POA: Diagnosis not present

## 2018-01-20 DIAGNOSIS — E785 Hyperlipidemia, unspecified: Secondary | ICD-10-CM | POA: Diagnosis not present

## 2018-01-20 DIAGNOSIS — R7989 Other specified abnormal findings of blood chemistry: Secondary | ICD-10-CM | POA: Diagnosis not present

## 2018-02-05 DIAGNOSIS — H43811 Vitreous degeneration, right eye: Secondary | ICD-10-CM | POA: Diagnosis not present

## 2018-02-05 DIAGNOSIS — H04123 Dry eye syndrome of bilateral lacrimal glands: Secondary | ICD-10-CM | POA: Diagnosis not present

## 2018-03-19 DIAGNOSIS — M542 Cervicalgia: Secondary | ICD-10-CM | POA: Diagnosis not present

## 2018-03-19 DIAGNOSIS — M5412 Radiculopathy, cervical region: Secondary | ICD-10-CM | POA: Diagnosis not present

## 2018-03-19 DIAGNOSIS — R030 Elevated blood-pressure reading, without diagnosis of hypertension: Secondary | ICD-10-CM | POA: Diagnosis not present
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
## 2018-03-26 ENCOUNTER — Ambulatory Visit: Payer: 59 | Admitting: Nurse Practitioner

## 2018-03-26 ENCOUNTER — Encounter: Payer: Self-pay | Admitting: Nurse Practitioner

## 2018-03-26 ENCOUNTER — Other Ambulatory Visit: Payer: Self-pay | Admitting: Nurse Practitioner

## 2018-03-26 VITALS — BP 121/73 | HR 84 | Ht 64.0 in | Wt 159.4 lb

## 2018-03-26 DIAGNOSIS — G4710 Hypersomnia, unspecified: Secondary | ICD-10-CM

## 2018-03-26 DIAGNOSIS — H43811 Vitreous degeneration, right eye: Secondary | ICD-10-CM | POA: Diagnosis not present

## 2018-03-26 DIAGNOSIS — G4733 Obstructive sleep apnea (adult) (pediatric): Secondary | ICD-10-CM | POA: Diagnosis not present

## 2018-03-26 MED ORDER — MODAFINIL 200 MG PO TABS: 200.0000 mg | ORAL_TABLET | Freq: Every day | ORAL | 5 refills | Status: DC

## 2018-03-26 NOTE — Patient Instructions (Signed)
Continue Modafinil 200mg  1/2 in the am and 1/2 prior to 2pm.will refill Continue  oral appliance with Dr. Toni ArthursFuller Sign to get recent sleep study and compliance from Dr. Toni ArthursFuller Do not drive when sleepy Follow-up in 6 months

## 2018-03-26 NOTE — Progress Notes (Signed)
Fax confirmation received modafinil [facility] [phone]. sy

## 2018-04-01 ENCOUNTER — Telehealth: Payer: Self-pay | Admitting: *Deleted

## 2018-04-01 NOTE — Telephone Encounter (Signed)
Please obtain a clearer copy of the sleep report thanks

## 2018-04-01 NOTE — Telephone Encounter (Signed)
To Carolyn/NP.

## 2018-04-02 NOTE — Telephone Encounter (Signed)
I reviewed records from Dr. Alveda ReasonsFuller's office. I reviewed her home sleep test reports from 03/05/2018 which was labeled a pre-op and the home sleep test from 03/04/2018 which was labeled postop. Her AHI was 14.3 per hour and 13.3 per hour respectively, O2 nadir was 85% for the study that was labeled preop and 88% for the study that was labeled postop. Please call patient and remind her to follow-up with Dr. Toni ArthursFuller regarding her sleep apnea which is not fully treated as yet, as far as I can see.

## 2018-04-02 NOTE — Telephone Encounter (Signed)
I called pt and discussed Dr. Teofilo PodAthar's recommendations with her. Pt reports that she is in close follow up with Dr. Toni ArthursFuller. Pt verbalized understanding of Dr. Teofilo PodAthar's recommendations based on these two sleep study results. Pt had no questions at this time but was encouraged to call back if questions arise.

## 2018-04-02 NOTE — Telephone Encounter (Signed)
Spoke to Dr. Alveda ReasonsFuller's office this am.  Received email with information as other was too distorted when faxed.  Placed on Dr. Teofilo PodAthar's desk for review per CM/NP.

## 2018-05-28 ENCOUNTER — Encounter: Payer: Self-pay | Admitting: Gastroenterology

## 2018-06-24 ENCOUNTER — Ambulatory Visit: Payer: 59 | Admitting: Gastroenterology

## 2018-06-24 ENCOUNTER — Other Ambulatory Visit (INDEPENDENT_AMBULATORY_CARE_PROVIDER_SITE_OTHER): Payer: 59

## 2018-06-24 ENCOUNTER — Encounter (INDEPENDENT_AMBULATORY_CARE_PROVIDER_SITE_OTHER): Payer: Self-pay

## 2018-06-24 ENCOUNTER — Encounter: Payer: Self-pay | Admitting: Gastroenterology

## 2018-06-24 VITALS — BP 134/70 | HR 88 | Ht 64.0 in | Wt 161.5 lb

## 2018-06-24 DIAGNOSIS — K219 Gastro-esophageal reflux disease without esophagitis: Secondary | ICD-10-CM

## 2018-06-24 DIAGNOSIS — R194 Change in bowel habit: Secondary | ICD-10-CM

## 2018-06-24 DIAGNOSIS — R109 Unspecified abdominal pain: Secondary | ICD-10-CM | POA: Diagnosis not present

## 2018-06-24 DIAGNOSIS — R1084 Generalized abdominal pain: Secondary | ICD-10-CM

## 2018-06-24 DIAGNOSIS — R634 Abnormal weight loss: Secondary | ICD-10-CM | POA: Diagnosis not present

## 2018-06-24 LAB — COMPREHENSIVE METABOLIC PANEL
ALBUMIN: 4.5 g/dL (ref 3.5–5.2)
ALT: 21 U/L (ref 0–35)
AST: 17 U/L (ref 0–37)
Alkaline Phosphatase: 102 U/L (ref 39–117)
BUN: 16 mg/dL (ref 6–23)
CALCIUM: 9.4 mg/dL (ref 8.4–10.5)
CO2: 28 mEq/L (ref 19–32)
CREATININE: 1.28 mg/dL — AB (ref 0.40–1.20)
Chloride: 105 mEq/L (ref 96–112)
GFR: 42.69 mL/min — AB (ref >60.00)
Glucose, Bld: 77 mg/dL (ref 70–99)
Potassium: 3.7 mEq/L (ref 3.5–5.1)
Sodium: 141 mEq/L (ref 135–145)
Total Bilirubin: 0.4 mg/dL (ref 0.2–1.2)
Total Protein: 7 g/dL (ref 6.0–8.3)

## 2018-06-24 LAB — CBC WITH DIFFERENTIAL/PLATELET
Basophils Absolute: 0 10*3/uL (ref 0.0–0.1)
Basophils Relative: 0.5 % (ref 0.0–3.0)
EOS ABS: 0.1 10*3/uL (ref 0.0–0.7)
Eosinophils Relative: 1.2 % (ref 0.0–5.0)
HEMATOCRIT: 39.6 % (ref 36.0–46.0)
Hemoglobin: 13.7 g/dL (ref 12.0–15.0)
LYMPHS PCT: 29.5 % (ref 12.0–46.0)
Lymphs Abs: 2.1 10*3/uL (ref 0.7–4.0)
MCHC: 34.6 g/dL (ref 30.0–36.0)
MCV: 84.9 fl (ref 78.0–100.0)
MONOS PCT: 7.7 % (ref 3.0–12.0)
Monocytes Absolute: 0.5 10*3/uL (ref 0.1–1.0)
NEUTROS ABS: 4.3 10*3/uL (ref 1.4–7.7)
Neutrophils Relative %: 61.1 % (ref 43.0–77.0)
PLATELETS: 237 10*3/uL (ref 150.0–400.0)
RBC: 4.67 Mil/uL (ref 3.87–5.11)
RDW: 12.8 % (ref 11.5–15.5)
WBC: 7 10*3/uL (ref 4.0–10.5)

## 2018-06-24 LAB — LIPASE: Lipase: 43 U/L (ref 11.0–59.0)

## 2018-06-24 MED ORDER — PREDNISONE 50 MG PO TABS: ORAL_TABLET | ORAL | 0 refills | Status: DC

## 2018-06-24 MED ORDER — OMEPRAZOLE 40 MG PO CPDR: 40.0000 mg | DELAYED_RELEASE_CAPSULE | Freq: Every day | ORAL | 3 refills | Status: DC

## 2018-06-24 NOTE — Progress Notes (Signed)
HPI :  58 y/o female asthma, allergies, depression, hypothyroid, referred here for new patient visit by Dr. Merri Brunette for abdominal pain, change in bowel habits, nausea.  Patient reports she has been having intermittent pain that starts initially in her right upper quadrant for the past year or so at least. She reports the pain as a pressure that can radiate down into her lower abdomen and rectum. She feels that she salivates when this occurs but does not vomit, she also feels diaphoretic. This is associated with an urgency to have a bowel movement and then she will have multiple loose stools until she eventually feels better. This occurs most days of the week, although some days she will not feel it at all. The discomfort builds up and lasts for 3-4 hours of time before resolving. Symptoms got much worse this past October/November time frame. At her prior job she felt a lot of stress however, new job in January and feels even less stress than before and her symptoms are worse. She's been altering her diet and eating less to minimize her symptoms, she has lost about 12 pounds since January. Eating reliably something that is heavier greasy we'll make her symptoms worse. She says she can have anywhere from 2-6 bowel movements with an episode. She occasionally will see some scant blood in her stool. She has had some episodes of incontinence due to the urgency.  She has a history of reflux which has been bothering her. She has been using cimetidine for this as needed due to her history of allergies, although it is not working too well for her reflux and she continues to have pain. She has had a prior endoscopy and colonoscopy with Dr. Loreta Ave, reports of this are not available at today's visit. She has had a history of cholecystectomy in 2011. She thinks she's had some labs done over the past year for this although unclear. There are no labs on file that are available today. She does endorse a history of  elevated triglycerides and lipids in the past, she thinks done within the past year, she is not sure. She does not take anything for cholesterol, she thinks she had a reaction to statins in the past.    Past Medical History:  Diagnosis Date  . Allergic rhinitis   . Asthma   . Colon polyps   . Depression   . Fibromyalgia   . Gallstones   . Headache, chronic daily   . HLD (hyperlipidemia)   . IBS (irritable bowel syndrome)   . Lupus ([facility])   . Sleep apnea   . Thyroid disorder      Past Surgical History:  Procedure Laterality Date  . CERVICAL FUSION  11/2015  . CESAREAN SECTION  1987  . CHOLECYSTECTOMY  2011  . LAPAROSCOPIC ENDOMETRIOSIS FULGURATION    . MYRINGOTOMY    . PARTIAL HYSTERECTOMY  1999   Family History  Problem Relation Age of Onset  . Heart disease Mother   . Hypertension Mother   . Stroke Mother   . Allergic rhinitis Mother   . Scleroderma Mother   . Colon polyps Mother   . Diabetes Mother   . Irritable bowel syndrome Mother   . Diverticulitis Mother   . Heart attack Maternal Grandfather   . Heart murmur Brother   . Lung cancer Maternal Aunt   . Heart disease Maternal Aunt   . Lung cancer Maternal Uncle   . Heart disease Maternal Uncle   .  Heart disease Maternal Uncle   . Heart disease Maternal Uncle   . Diabetes Maternal Uncle    Social History   Tobacco Use  . Smoking status: Never Smoker  . Smokeless tobacco: Never Used  Substance Use Topics  . Alcohol use: Yes    Comment: <1 daily  . Drug use: No   Current Outpatient Medications  Medication Sig Dispense Refill  . AUVI-Q 0.3 MG/0.3ML SOAJ injection Use as directed for life-threatening allergic reaction. 4 Device 2  . cetirizine (ZYRTEC) 10 MG tablet Take 10 mg by mouth daily.    . chlorpheniramine-HYDROcodone (TUSSIONEX) 10-8 MG/5ML SUER TAKE BY MOUTH EVERY 12 HOURS prn  0  . Cholecalciferol (VITAMIN D3) 5000 units CAPS Take 1 capsule by mouth daily.    . clotrimazole-betamethasone  (LOTRISONE) cream     . diphenhydrAMINE (BENADRYL) 25 MG tablet Take 25 mg by mouth every 6 (six) hours as needed.    Marland Kitchen imipramine (TOFRANIL) 25 MG tablet Take 25 mg by mouth at bedtime.     Marland Kitchen KLOR-CON 10 10 MEQ tablet     . levothyroxine (SYNTHROID, LEVOTHROID) 100 MCG tablet Take 100 mcg by mouth daily. Monday-Friday    . metaxalone (SKELAXIN) 800 MG tablet     . modafinil (PROVIGIL) 200 MG tablet Take 1 tablet (200 mg total) by mouth daily. 30 tablet 5  . mupirocin ointment (BACTROBAN) 2 %     . NAPROXEN SODIUM PO Take 220 mg by mouth as needed.    . Olopatadine HCl 0.2 % SOLN     . sertraline (ZOLOFT) 100 MG tablet Take 100 mg by mouth daily.    Marland Kitchen SYNTHROID 88 MCG tablet 88 mcg. Saturday and Sunday     No current facility-administered medications for this visit.    Facility-Administered Medications Ordered in Other Visits  Medication Dose Route Frequency Provider Last Rate Last Dose  . gadopentetate dimeglumine (MAGNEVIST) injection 15 mL  15 mL Intravenous Once PRN Anson Fret, MD       Allergies  Allergen Reactions  . Sulfa Antibiotics Anaphylaxis  . Sulfonamide Derivatives Anaphylaxis    Delayed anaphylaxis   . Amoxicillin   . Amoxicillin-Pot Clavulanate Hives  . Cephalexin Hives  . Ciprofloxacin   . Clarithromycin Hives    Serum sickness syndrome  . Doxycycline     REACTION: unknown  . Erythromycin Hives and Other (See Comments)    Headaches  . Indomethacin   . Iodine Other (See Comments)    Nausea and migraine  . Lidocaine     Headaches and nausea  . Penicillins     Serum sickness syndrome  . Xylocaine  [Lidocaine Hcl] Other (See Comments)    Headaches and HIves  . Zithromax [Azithromycin] Hives    Serum sickness syndrome     Review of Systems: All systems reviewed and negative except where noted in HPI.    No results found.  Physical Exam: BP 134/70 (BP Location: Left Arm, Patient Position: Sitting, Cuff Size: Normal)   Pulse 88   Ht 5\' 4"   (1.626 m) Comment: height measured without shoes  Wt 161 lb 8 oz (73.3 kg)   BMI 27.72 kg/m  Constitutional: Pleasant,well-developed, female in no acute distress. HEENT: Normocephalic and atraumatic. Conjunctivae are normal. No scleral icterus. Neck supple.  Cardiovascular: Normal rate, regular rhythm.  Pulmonary/chest: Effort normal and breath sounds normal. No wheezing, rales or rhonchi. Abdominal: Soft, nondistended, nontender. There are no masses palpable. No hepatomegaly. Extremities: no edema  Lymphadenopathy: No cervical adenopathy noted. Neurological: Alert and oriented to person place and time. Skin: Skin is warm and dry. No rashes noted. Psychiatric: Normal mood and affect. Behavior is normal.   ASSESSMENT AND PLAN: 58 year old female here for new patient consultation regarding the following issues:  Abdominal pain / altered bowel habits / weight loss / GERD - she is status post cholecystectomy, I discussed differential for her symptoms with her. Her pain is in multiple locations - starts in RUQ and then radiates to multiple areas, and hard to pin down. I'm going to check some baseline labs today to ensure no anemia, that her LFTs are normal as well as pancreatic enzymes. She has not had any imaging of her abdomen in several years. Given her weight loss and progressive symptoms will recommend a CT scan of the abdomen and pelvis with contrast to further evaluate. I will get records of her last endoscopy and colonoscopy from Dr. Kenna Gilbert office, as well as labs from Dr. Carolee Rota office. I'm going to give her a trial of omeprazole 40 mg once a day to see if that will help any of her prandial symptoms, as she does have some reflux which is bothering her think it will certainly help that component. I think given her urgency with loose stools than the postcholecystectomy state, trial of Colestid is reasonable however will need her lipid panel back before giving that to her. She may likely warrant  endoscopy and colonoscopy if symptoms persist but will await initial labs and imaging first. She agreed with the plan, further recommendations pending results and her course.   Ileene Patrick, MD [facility]  CC: Merri Brunette, MD

## 2018-06-24 NOTE — Patient Instructions (Addendum)
If you are age 59 or older, your body mass index should be between 23-30. Your Body mass index is 27.72 kg/m. If this is out of the aforementioned range listed, please consider follow up with your Primary Care Provider.  If you are age 16 or younger, your body mass index should be between 19-25. Your Body mass index is 27.72 kg/m. If this is out of the aformentioned range listed, please consider follow up with your Primary Care Provider.   Please go to the lab in the basement of our building to have lab work done as you leave today. Hit "B" for basement when you get on the elevator.  When the doors open the lab is on your left.  We will call you with the results. Thank you.   You have been scheduled for a CT scan of the abdomen and pelvis at [facility] ([address]---this is in the same building as [facility]).   You are scheduled on Monday, 3-16 at 9:30am. You should arrive 15 minutes prior to your appointment time for registration. Please follow the written instructions below on the day of your exam:  Since you are allergic to one or more components in IV contrast, we have sent a prescription for (3) 50 mg tablets of Prednisone to your pharmacy as a Pre-Medication Prep for your upcoming procedure requiring contrast.    Take (1) 50 mg tablet of prednisone 13 hours prior to your procedure at 8:30PM.   Take (1) 50 mg tablet 7 hours prior to your procedure at 2:30AM.    Take (1) 50 mg tablet 1 hour prior to your procedure at 8:30AM.    You also need to take 50 mg of Benadryl 1 hour prior to your procedure at 8:30AM.  If you have 25 mg tablets of Benadryl, which can be purchased over the counter, you may take 2 tablets.  Otherwise we can send a prescription for (1) 50 mg tablet of Benadryl to your pharmacy.    1) Do not eat anything after 5:30am (4 hours prior to your test) 2) You have been given 2 bottles of oral contrast to drink. The solution may taste better if  refrigerated, but do NOT add ice or any other liquid to this solution. Shake well before drinking.    Drink 1 bottle of contrast @ 7:30AM (2 hours prior to your exam)  Drink 1 bottle of contrast @ 8:30AM (1 hour prior to your exam)  You may take any medications as prescribed with a small amount of water, if necessary. If you take any of the following medications: METFORMIN, GLUCOPHAGE, GLUCOVANCE, AVANDAMET, RIOMET, FORTAMET, [place] MET, JANUMET, GLUMETZA or METAGLIP, you MAY be asked to HOLD this medication 48 hours AFTER the exam.  The purpose of you drinking the oral contrast is to aid in the visualization of your intestinal tract. The contrast solution may cause some diarrhea. Depending on your individual set of symptoms, you may also receive an intravenous injection of x-ray contrast/dye. Plan on being at [facility] for 30 minutes or longer, depending on the type of exam you are having performed.  This test typically takes 30-45 minutes to complete.  If you have any questions regarding your exam or if you need to reschedule, you may call the CT department at [phone] between the hours of 8:00 am and 5:00 pm, Monday-Friday.  ________________________________  We have sent the following medications to your pharmacy for you to pick up at your  convenience: Omeprazole 61m: Take once daily  We will request your records from Dr. PShelia Mediaand Dr. MCollene Mares  Thank you for entrusting me with your care and [facility] SC[facility]

## 2018-06-27 ENCOUNTER — Telehealth: Payer: Self-pay | Admitting: Gastroenterology

## 2018-06-27 NOTE — Telephone Encounter (Signed)
Contacted patient and gave her all recommendations and reviewed the recent lab work.

## 2018-06-27 NOTE — Telephone Encounter (Signed)
Pt return your call asking for more clarification of the contrast and to also speak about the lab results.

## 2018-06-30 ENCOUNTER — Ambulatory Visit (INDEPENDENT_AMBULATORY_CARE_PROVIDER_SITE_OTHER)
Admission: RE | Admit: 2018-06-30 | Discharge: 2018-06-30 | Disposition: A | Discharge status: Home / Self Care | Payer: 59 | Source: Ambulatory Visit | Attending: Gastroenterology | Admitting: Gastroenterology

## 2018-06-30 ENCOUNTER — Other Ambulatory Visit: Payer: Self-pay

## 2018-06-30 DIAGNOSIS — R1084 Generalized abdominal pain: Secondary | ICD-10-CM

## 2018-06-30 DIAGNOSIS — R110 Nausea: Secondary | ICD-10-CM | POA: Diagnosis not present

## 2018-06-30 DIAGNOSIS — R109 Unspecified abdominal pain: Secondary | ICD-10-CM | POA: Diagnosis not present

## 2018-06-30 MED ORDER — IOHEXOL 300 MG/ML  SOLN
100.0000 mL | Freq: Once | INTRAMUSCULAR | Status: AC | PRN
  Administered 2018-06-30: 100 mL via INTRAVENOUS

## 2018-07-10 ENCOUNTER — Telehealth: Payer: Self-pay | Admitting: *Deleted

## 2018-07-10 NOTE — Telephone Encounter (Addendum)
Approval letter faxed to [facility], [employer].

## 2018-07-10 NOTE — Telephone Encounter (Signed)
Request Reference Number: [id number]. MODAFINIL TAB 200MG  is approved through 07/10/2019. For further questions, call [phone].

## 2018-07-10 NOTE — Telephone Encounter (Signed)
Started Modafinil PA on [facility], [facility], key: [id number] - PA Case ID: [id number]. 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  For continuation of therapy.

## 2018-08-12 DIAGNOSIS — R1011 Right upper quadrant pain: Secondary | ICD-10-CM | POA: Diagnosis not present

## 2018-08-14 DIAGNOSIS — N289 Disorder of kidney and ureter, unspecified: Secondary | ICD-10-CM | POA: Diagnosis not present

## 2018-09-23 ENCOUNTER — Telehealth: Payer: Self-pay | Admitting: Neurology

## 2018-09-23 NOTE — Telephone Encounter (Signed)
Due to current COVID 19 pandemic, our office is severely reducing in office visits until further notice, in order to minimize the risk to our patients and healthcare providers.   Called patient and LVM x3 regarding her 6/11 appointment. I gave my direct line and general office number for reference. Cancellation letter sent. If patient calls back please offer virtual visit.

## 2018-09-25 ENCOUNTER — Ambulatory Visit: Payer: 59 | Admitting: Neurology

## 2018-10-06 ENCOUNTER — Other Ambulatory Visit: Payer: Self-pay

## 2018-10-06 MED ORDER — MODAFINIL 200 MG PO TABS: 200.0000 mg | ORAL_TABLET | Freq: Every day | ORAL | 3 refills | Status: DC

## 2018-10-06 NOTE — Telephone Encounter (Signed)
[place] Database Verified LR: 09/01/2018 Qty: 30 Pending appointment: No pending appt

## 2018-10-06 NOTE — Telephone Encounter (Signed)
pls advise patient to make a FU appt

## 2018-10-07 ENCOUNTER — Telehealth: Payer: Self-pay

## 2018-10-07 NOTE — Telephone Encounter (Signed)
Unable to get in contact with the patient to schedule a follow up. Phone went straight to voicemail, unable to leave a voicemail due to the mailbox being full.  If patient calls back please schedule her a follow up appt with Myra Rude or Amy.

## 2018-10-20 IMAGING — DX DG CERVICAL SPINE COMPLETE 4+V
6 series · 6 of 6 positions shown · non-contrast
Comparison: 01/16/2016

CLINICAL DATA: Trip and fall with neck pain, initial encounter

EXAM:
CERVICAL SPINE - COMPLETE 4+ VIEW

[c-spine lat]
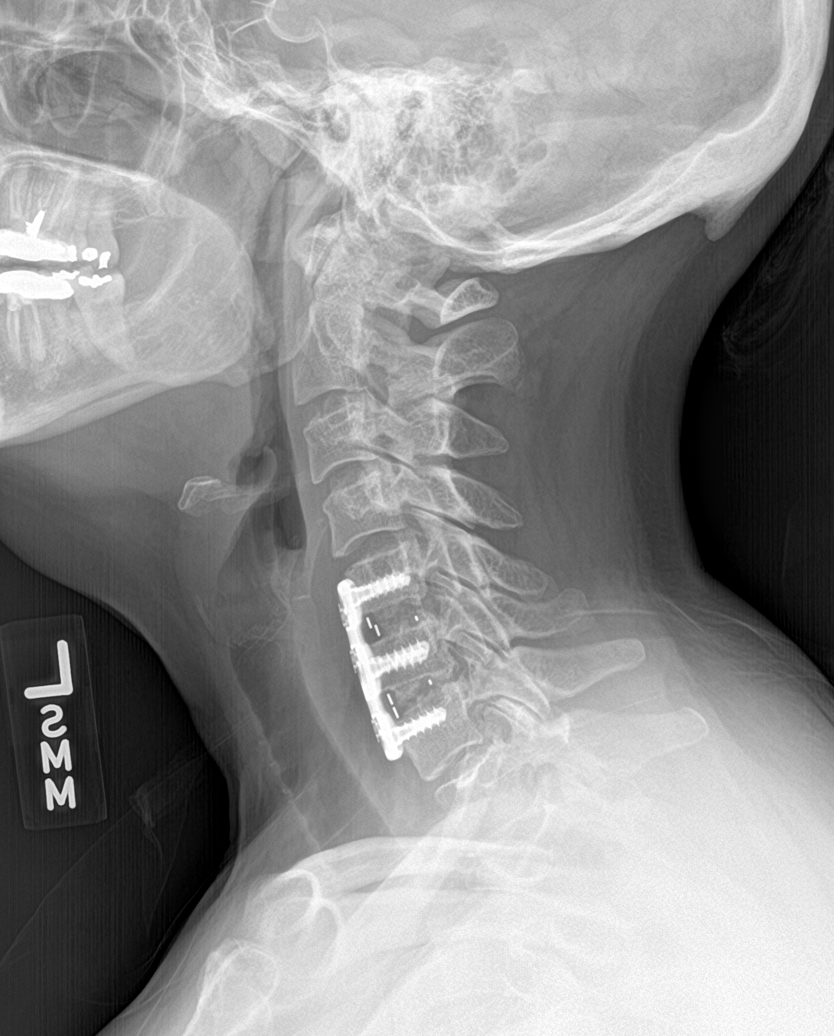

[c-spine obl (1 of 2)]
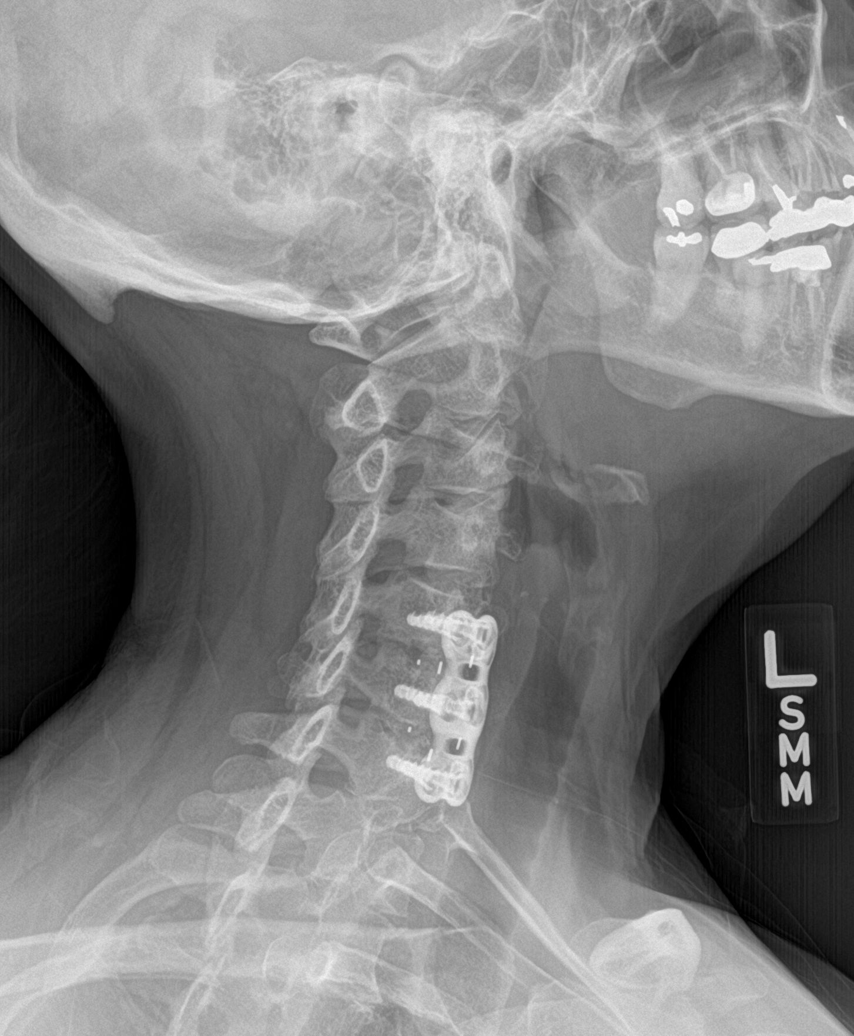

[c-spine obl (2 of 2)]
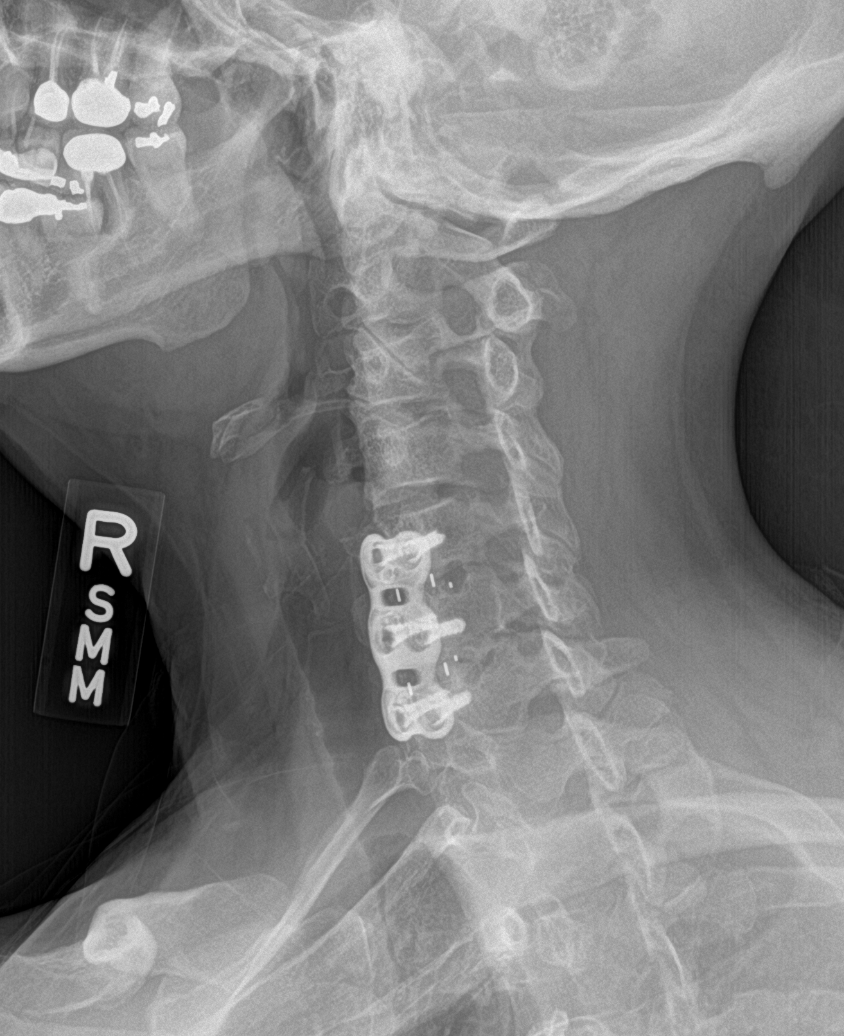

[c-spine ap (1 of 2)]
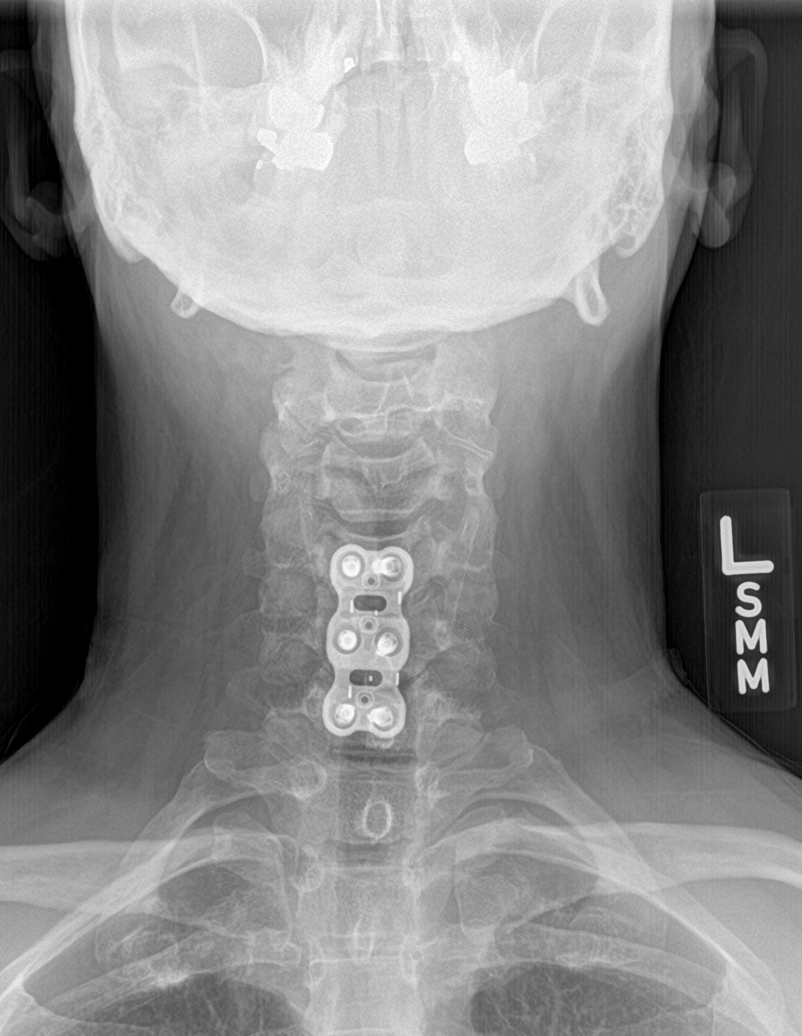

[c-spine open mouth]
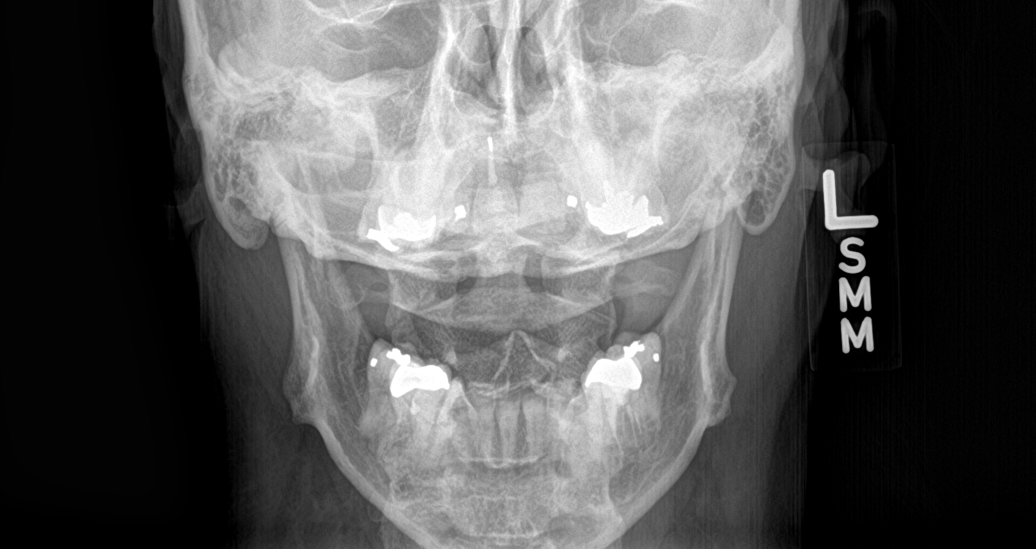

[c-spine ap (2 of 2)]
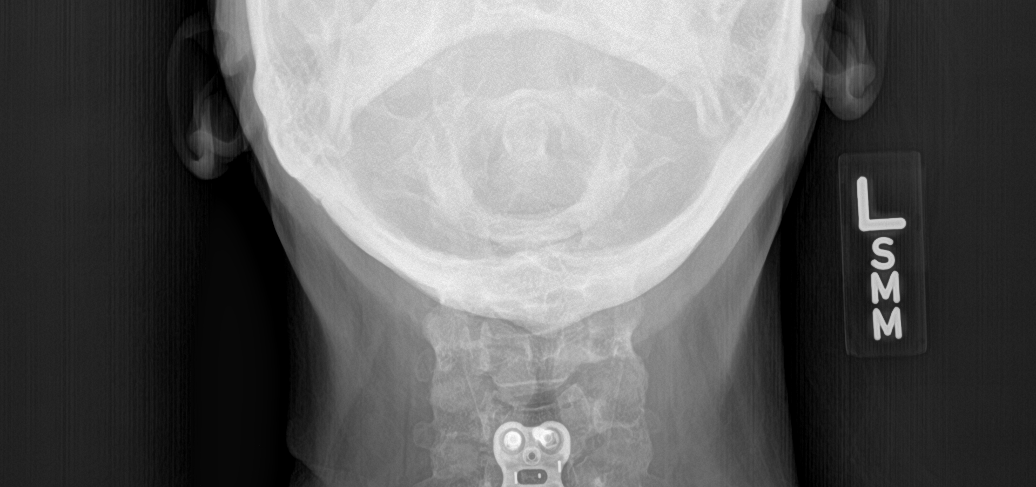

[6 of 6 positions shown; findings below may reference images not displayed]

FINDINGS: Postsurgical changes are again seen from C5-C7. The overall
appearance is stable. No acute fracture is identified. No
significant neural foraminal changes are noted.
IMPRESSION: Postsurgical changes without acute abnormality.

## 2018-11-22 ENCOUNTER — Other Ambulatory Visit: Payer: Self-pay | Admitting: Gastroenterology

## 2019-02-10 ENCOUNTER — Encounter (INDEPENDENT_AMBULATORY_CARE_PROVIDER_SITE_OTHER): Payer: Self-pay

## 2019-02-17 ENCOUNTER — Other Ambulatory Visit: Payer: Self-pay

## 2019-02-17 DIAGNOSIS — Z20822 Contact with and (suspected) exposure to covid-19: Secondary | ICD-10-CM

## 2019-02-18 LAB — NOVEL CORONAVIRUS, NAA: SARS-CoV-2, NAA: NOT DETECTED

## 2019-03-25 ENCOUNTER — Other Ambulatory Visit: Payer: Self-pay | Admitting: Internal Medicine

## 2019-03-25 DIAGNOSIS — Z1231 Encounter for screening mammogram for malignant neoplasm of breast: Secondary | ICD-10-CM

## 2019-04-13 ENCOUNTER — Ambulatory Visit: Payer: 59 | Admitting: Family Medicine

## 2019-04-13 ENCOUNTER — Encounter: Payer: Self-pay | Admitting: Family Medicine

## 2019-04-13 ENCOUNTER — Other Ambulatory Visit: Payer: Self-pay

## 2019-04-13 VITALS — BP 107/63 | HR 78 | Temp 97.2°F | Ht 64.0 in | Wt 150.4 lb

## 2019-04-13 DIAGNOSIS — G4710 Hypersomnia, unspecified: Secondary | ICD-10-CM

## 2019-04-13 DIAGNOSIS — G4733 Obstructive sleep apnea (adult) (pediatric): Secondary | ICD-10-CM | POA: Diagnosis not present

## 2019-04-13 DIAGNOSIS — G4730 Sleep apnea, unspecified: Secondary | ICD-10-CM

## 2019-04-13 MED ORDER — MODAFINIL 200 MG PO TABS: 200.0000 mg | ORAL_TABLET | Freq: Every day | ORAL | 3 refills | Status: DC

## 2019-04-13 NOTE — Patient Instructions (Signed)
We will continue modafinil for now. Please continue working closely with dentistry for appropriate fit of oral appliance.   Follow up in 6 months, sooner if needed.   Sleep Apnea Sleep apnea affects breathing during sleep. It causes breathing to stop for a short time or to become shallow. It can also increase the risk of:  Heart attack.  Stroke.  Being very overweight (obese).  Diabetes.  Heart failure.  Irregular heartbeat. The goal of treatment is to help you breathe normally again. What are the causes? There are three kinds of sleep apnea:  Obstructive sleep apnea. This is caused by a blocked or collapsed airway.  Central sleep apnea. This happens when the brain does not send the right signals to the muscles that control breathing.  Mixed sleep apnea. This is a combination of obstructive and central sleep apnea. The most common cause of this condition is a collapsed or blocked airway. This can happen if:  Your throat muscles are too relaxed.  Your tongue and tonsils are too large.  You are overweight.  Your airway is too small. What increases the risk?  Being overweight.  Smoking.  Having a small airway.  Being older.  Being female.  Drinking alcohol.  Taking medicines to calm yourself (sedatives or tranquilizers).  Having family members with the condition. What are the signs or symptoms?  Trouble staying asleep.  Being sleepy or tired during the day.  Getting angry a lot.  Loud snoring.  Headaches in the morning.  Not being able to focus your mind (concentrate).  Forgetting things.  Less interest in sex.  Mood swings.  Personality changes.  Feelings of sadness (depression).  Waking up a lot during the night to pee (urinate).  Dry mouth.  Sore throat. How is this diagnosed?  Your medical history.  A physical exam.  A test that is done when you are sleeping (sleep study). The test is most often done in a sleep lab but may also  be done at home. How is this treated?   Sleeping on your side.  Using a medicine to get rid of mucus in your nose (decongestant).  Avoiding the use of alcohol, medicines to help you relax, or certain pain medicines (narcotics).  Losing weight, if needed.  Changing your diet.  Not smoking.  Using a machine to open your airway while you sleep, such as: ? An oral appliance. This is a mouthpiece that shifts your lower jaw forward. ? A CPAP device. This device blows air through a mask when you breathe out (exhale). ? An EPAP device. This has valves that you put in each nostril. ? A BPAP device. This device blows air through a mask when you breathe in (inhale) and breathe out.  Having surgery if other treatments do not work. It is important to get treatment for sleep apnea. Without treatment, it can lead to:  High blood pressure.  Coronary artery disease.  In men, not being able to have an erection (impotence).  Reduced thinking ability. Follow these instructions at home: Lifestyle  Make changes that your doctor recommends.  Eat a healthy diet.  Lose weight if needed.  Avoid alcohol, medicines to help you relax, and some pain medicines.  Do not use any products that contain nicotine or tobacco, such as cigarettes, e-cigarettes, and chewing tobacco. If you need help quitting, ask your doctor. General instructions  Take over-the-counter and prescription medicines only as told by your doctor.  If you were given a machine  to use while you sleep, use it only as told by your doctor.  If you are having surgery, make sure to tell your doctor you have sleep apnea. You may need to bring your device with you.  Keep all follow-up visits as told by your doctor. This is important. Contact a doctor if:  The machine that you were given to use during sleep bothers you or does not seem to be working.  You do not get better.  You get worse. Get help right away if:  Your chest  hurts.  You have trouble breathing in enough air.  You have an uncomfortable feeling in your back, arms, or stomach.  You have trouble talking.  One side of your body feels weak.  A part of your face is hanging down. These symptoms may be an emergency. Do not wait to see if the symptoms will go away. Get medical help right away. Call your local emergency services (911 in the [place] Do not drive yourself to the hospital. Summary  This condition affects breathing during sleep.  The most common cause is a collapsed or blocked airway.  The goal of treatment is to help you breathe normally while you sleep. This information is not intended to replace advice given to you by your health care provider. Make sure you discuss any questions you have with your health care provider. Document Released: 01/10/2008 Document Revised: 01/17/2018 Document Reviewed: 11/26/2017 Elsevier Patient Education  2020 [employer].
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
## 2019-10-12 ENCOUNTER — Encounter: Payer: Self-pay | Admitting: Family Medicine

## 2019-10-12 ENCOUNTER — Other Ambulatory Visit: Payer: Self-pay

## 2019-10-12 ENCOUNTER — Ambulatory Visit: Payer: 59 | Admitting: Family Medicine

## 2019-10-12 VITALS — BP 129/72 | HR 74 | Ht 64.0 in | Wt 151.0 lb

## 2019-10-12 DIAGNOSIS — G4730 Sleep apnea, unspecified: Secondary | ICD-10-CM | POA: Diagnosis not present

## 2019-10-12 DIAGNOSIS — G4710 Hypersomnia, unspecified: Secondary | ICD-10-CM | POA: Diagnosis not present

## 2019-10-12 DIAGNOSIS — G4733 Obstructive sleep apnea (adult) (pediatric): Secondary | ICD-10-CM | POA: Diagnosis not present

## 2019-10-12 MED ORDER — MODAFINIL 200 MG PO TABS: 200.0000 mg | ORAL_TABLET | Freq: Every day | ORAL | 3 refills | Status: DC

## 2019-10-12 NOTE — Patient Instructions (Addendum)
We will continue modafinil 269m daily as prescribed. You are doing a fantastic job caring for your mother and continuing to support yourself. Please reach out to your counselor as needed. You can always call or message me if there is something you need. I look forward to seeing you in 6 months   Dementia Caregiver Guide Dementia is a term used to describe a number of symptoms that affect memory and thinking. The most common symptoms include:  Memory loss.  Trouble with language and communication.  Trouble concentrating.  Poor judgment.  Problems with reasoning.  Child-like behavior and language.  Extreme anxiety.  Angry outbursts.  Wandering from home or public places. Dementia usually gets worse slowly over time. In the early stages, people with dementia can stay independent and safe with some help. In later stages, they need help with daily tasks such as dressing, grooming, and using the bathroom. How to help the person with dementia cope Dementia can be frightening and confusing. Here are some tips to help the person with dementia cope with changes caused by the disease. General tips  Keep the person on track with his or her routine.  Try to identify areas where the person may need help.  Be supportive, patient, calm, and encouraging.  Gently remind the person that adjusting to changes takes time.  Help with the tasks that the person has asked for help with.  Keep the person involved in daily tasks and decisions as much as possible.  Encourage conversation, but try not to get frustrated or harried if the person struggles to find words or does not seem to appreciate your help. Communication tips  When the person is talking or seems frustrated, make eye contact and hold the person's hand.  Ask specific questions that need yes or no answers.  Use simple words, short sentences, and a calm voice. Only give one direction at a time.  When offering choices, limit them  to just 1 or 2.  Avoid correcting the person in a negative way.  If the person is struggling to find the right words, gently try to help him or her. How to recognize symptoms of stress Symptoms of stress in caregivers include:  Feeling frustrated or angry with the person with dementia.  Denying that the person has dementia or that his or her symptoms will not improve.  Feeling hopeless and unappreciated.  Difficulty sleeping.  Difficulty concentrating.  Feeling anxious, irritable, or depressed.  Developing stress-related health problems.  Feeling like you have too little time for your own life. Follow these instructions at home:   Make sure that you and the person you are caring for: ? Get regular sleep. ? Exercise regularly. ? Eat regular, nutritious meals. ? Drink enough fluid to keep your urine clear or pale yellow. ? Take over-the-counter and prescription medicines only as told by your health care providers. ? Attend all scheduled health care appointments.  Join a support group with others who are caregivers.  Ask about respite care resources so that you can have a regular break from the stress of caregiving.  Look for signs of stress in yourself and in the person you are caring for. If you notice signs of stress, take steps to manage it.  Consider any safety risks and take steps to avoid them.  Organize medications in a pill box for each day of the week.  Create a plan to handle any legal or financial matters. Get legal or financial advice if needed.  Keep a calendar in a central location to remind the person of appointments or other activities. Tips for reducing the risk of injury  Keep floors clear of clutter. Remove rugs, magazine racks, and floor lamps.  Keep hallways well lit, especially at night.  Put a handrail and nonslip mat in the bathtub or shower.  Put childproof locks on cabinets that contain dangerous items, such as medicines, alcohol, guns,  toxic cleaning items, sharp tools or utensils, matches, and lighters.  Put the locks in places where the person cannot see or reach them easily. This will help ensure that the person does not wander out of the house and get lost.  Be prepared for emergencies. Keep a list of emergency phone numbers and addresses in a convenient area.  Remove car keys and lock garage doors so that the person does not try to get in the car and drive.  Have the person wear a bracelet that tracks locations and identifies the person as having memory problems. This should be worn at all times for safety. Where to find support: Many individuals and organizations offer support. These include:  Support groups for people with dementia and for caregivers.  Counselors or therapists.  Home health care services.  Adult day care centers. Where to find more information [employer]: [employer] Contact a health care provider if:  The person's health is rapidly getting worse.  You are no longer able to care for the person.  Caring for the person is affecting your physical and emotional health.  The person threatens himself or herself, you, or anyone else. Summary  Dementia is a term used to describe a number of symptoms that affect memory and thinking.  Dementia usually gets worse slowly over time.  Take steps to reduce the person's risk of injury, and to plan for future care.  Caregivers need support, relief from caregiving, and time for their own lives. This information is not intended to replace advice given to you by your health care provider. Make sure you discuss any questions you have with your health care provider. Document Revised: 03/15/2017 Document Reviewed: 03/06/2016 Elsevier Patient Education  [place].   Fatigue If you have fatigue, you feel tired all the time and have a lack of energy or a lack of motivation. Fatigue may make it difficult to start or complete tasks  because of exhaustion. In general, occasional or mild fatigue is often a normal response to activity or life. However, long-lasting (chronic) or extreme fatigue may be a symptom of a medical condition. Follow these instructions at home: General instructions  Watch your fatigue for any changes.  Go to bed and get up at the same time every day.  Avoid fatigue by pacing yourself during the day and getting enough sleep at night.  Maintain a healthy weight. Medicines  Take over-the-counter and prescription medicines only as told by your health care provider.  Take a multivitamin, if told by your health care provider.  Do not use herbal or dietary supplements unless they are approved by your health care provider. Activity   Exercise regularly, as told by your health care provider.  Use or practice techniques to help you relax, such as yoga, tai chi, meditation, or massage therapy. Eating and drinking   Avoid heavy meals in the evening.  Eat a well-balanced diet, which includes lean proteins, whole grains, plenty of fruits and vegetables, and low-fat dairy products.  Avoid consuming too much caffeine.  Avoid the use of alcohol.  Drink enough fluid to keep your urine pale yellow. Lifestyle  Change situations that cause you stress. Try to keep your work and personal schedule in balance.  Do not use any products that contain nicotine or tobacco, such as cigarettes and e-cigarettes. If you need help quitting, ask your health care provider.  Do not use drugs. Contact a health care provider if:  Your fatigue does not get better.  You have a fever.  You suddenly lose or gain weight.  You have headaches.  You have trouble falling asleep or sleeping through the night.  You feel angry, guilty, anxious, or sad.  You are unable to have a bowel movement (constipation).  Your skin is dry.  You have swelling in your legs or another part of your body. Get help right away  if:  You feel confused.  Your vision is blurry.  You feel faint or you pass out.  You have a severe headache.  You have severe pain in your abdomen, your back, or the area between your waist and hips (pelvis).  You have chest pain, shortness of breath, or an irregular or fast heartbeat.  You are unable to urinate, or you urinate less than normal.  You have abnormal bleeding, such as bleeding from the rectum, vagina, nose, lungs, or nipples.  You vomit blood.  You have thoughts about hurting yourself or others. If you ever feel like you may hurt yourself or others, or have thoughts about taking your own life, get help right away. You can go to your nearest emergency department or call:  Your local emergency services (911 in the [place]  A suicide crisis helpline, such as the [place] at (573)598-3022. This is open 24 hours a day. Summary  If you have fatigue, you feel tired all the time and have a lack of energy or a lack of motivation.  Fatigue may make it difficult to start or complete tasks because of exhaustion.  Long-lasting (chronic) or extreme fatigue may be a symptom of a medical condition.  Exercise regularly, as told by your health care provider.  Change situations that cause you stress. Try to keep your work and personal schedule in balance. This information is not intended to replace advice given to you by your health care provider. Make sure you discuss any questions you have with your health care provider. Document Revised: 10/22/2018 Document Reviewed: 12/26/2016 Elsevier Patient Education  2020 [employer].
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
## 2020-04-12 ENCOUNTER — Ambulatory Visit: Payer: 59 | Admitting: Family Medicine

## 2020-04-25 ENCOUNTER — Other Ambulatory Visit: Payer: Self-pay | Admitting: *Deleted

## 2020-04-25 MED ORDER — MODAFINIL 200 MG PO TABS: 200.0000 mg | ORAL_TABLET | Freq: Every day | ORAL | 1 refills | Status: DC

## 2021-01-04 ENCOUNTER — Other Ambulatory Visit: Payer: Self-pay | Admitting: Internal Medicine

## 2021-01-04 DIAGNOSIS — E785 Hyperlipidemia, unspecified: Secondary | ICD-10-CM

## 2021-02-10 ENCOUNTER — Ambulatory Visit
Admission: RE | Admit: 2021-02-10 | Discharge: 2021-02-10 | Disposition: A | Discharge status: Home / Self Care | Payer: 59 | Source: Ambulatory Visit | Attending: Internal Medicine | Admitting: Internal Medicine

## 2021-02-10 DIAGNOSIS — E785 Hyperlipidemia, unspecified: Secondary | ICD-10-CM

## 2021-03-01 NOTE — Progress Notes (Signed)
    Subjective:    CC: L shoulder pain  I, Molly Weber, LAT, ATC, am serving as scribe for Dr. [name].  HPI: Pt is a 60 y/o female presenting w/ L shoulder pain x a few weeks- 1 month. Pt just woke up and when she went to raise her arm experienced a "searing" pain. She locates her pain to mid humerus.  Neck pain: no- but hx of a cervical fusion in 2017 Radiating pain: no L shoulder mechanical symptoms: yes Aggravating factors: any motions over 90 Treatments tried: using caution moving arm  Diagnostic testing: C-spine XR- 11/08/16  Pertinent review of Systems: No fevers or chills  Relevant historical information: Lupus, cervical fusion as above.  Fibromyalgia.   Objective:    Vitals:   03/02/21 1131  BP: 128/84  Pulse: 86  SpO2: 97%   General: Well Developed, well nourished, and in no acute distress.   MSK: C-spine: Normal-appearing Decreased cervical motion to lateral flexion. Nontender. Upper extremity strength is intact. Reflexes and sensation are intact distally bilateral upper extremities.  Left shoulder: Normal-appearing Nontender. Range of motion: Abduction full but painful beyond 100 degrees. Internal rotation lumbar spine.  External rotation full. Strength 4+/5 to abduction.  5/5 external and internal rotation. Negative empty can test. Minimally positive Hawkins and Neer's test.  Negative Yergason's and speeds test.   Lab and Radiology Results  X-ray images left shoulder obtained today personally and independently interpreted No acute fractures.  No severe degenerative changes. Await formal radiology review   Impression and Recommendations:    Assessment and Plan: 60 y.o. female with left shoulder pain.  Pain is most consistent with rotator cuff dysfunction or impingement.  Fortunately she does have preserved strength.  She is a good candidate for physical therapy.  She has an established relationship with breakthrough PT [facility] in [facility].  Refer to that location. Recheck back in about 6 weeks.  If not improved consider injection or MRI. Discussed medication management.  Patient is not experiencing much pain at bedtime therefore probably would not benefit from significant prescription medications at this time.  Over-the-counter medication should be safe to use if needed.  PDMP not reviewed this encounter. Orders Placed This Encounter  Procedures   DG Shoulder Left    Standing Status:   Future    Number of Occurrences:   1    Standing Expiration Date:   03/02/2022    Order Specific Question:   Reason for Exam (SYMPTOM  OR DIAGNOSIS REQUIRED)    Answer:   left shoulder pain    Order Specific Question:   Preferred imaging location?    Answer:   [facility]   Ambulatory referral to Physical Therapy    Referral Priority:   Routine    Referral Type:   Physical Medicine    Referral Reason:   Specialty Services Required    Requested Specialty:   Physical Therapy    Number of Visits Requested:   1   No orders of the defined types were placed in this encounter.   Discussed warning signs or symptoms. Please see discharge instructions. Patient expresses understanding.   The above documentation has been reviewed and is accurate and complete [name], M.D.

## 2021-03-02 ENCOUNTER — Other Ambulatory Visit: Payer: Self-pay

## 2021-03-02 ENCOUNTER — Ambulatory Visit (INDEPENDENT_AMBULATORY_CARE_PROVIDER_SITE_OTHER): Payer: 59

## 2021-03-02 ENCOUNTER — Ambulatory Visit: Payer: 59 | Admitting: Family Medicine

## 2021-03-02 VITALS — BP 128/84 | HR 86 | Ht 64.0 in | Wt 161.2 lb

## 2021-03-02 DIAGNOSIS — G8929 Other chronic pain: Secondary | ICD-10-CM | POA: Diagnosis not present

## 2021-03-02 DIAGNOSIS — M25512 Pain in left shoulder: Secondary | ICD-10-CM

## 2021-03-02 NOTE — Patient Instructions (Addendum)
Thank you for coming in today.  ° °I've referred you to Physical Therapy.  Let us know if you don't hear from them in one week.  ° °Please get an Xray today before you leave  ° °Recheck back in 6 weeks °

## 2021-03-06 NOTE — Progress Notes (Signed)
Left shoulder x-ray looks normal to radiology

## 2021-04-13 ENCOUNTER — Ambulatory Visit: Payer: No Typology Code available for payment source | Admitting: Family Medicine

## 2021-05-11 ENCOUNTER — Ambulatory Visit: Payer: No Typology Code available for payment source | Admitting: Family Medicine

## 2021-05-31 NOTE — Progress Notes (Signed)
I, Ashley Mathews, LAT, ATC, am serving as scribe for Dr. Clementeen Mathews.  Ashley Mathews is a 61 y.o. female who presents to [facility] at [facility] today for f/u of L shoulder pain most consistent with rotator cuff dysfunction or impingement.  She was last seen by Dr. Denyse Mathews on 03/02/21 and was referred to [facility].  Today, pt reports that her L shoulder is improving, rating her improvement at 40-50%.  She has followed-up w/ her neurosurgeon and has had a repeat cervical MRI.  She is also noting some similar symptoms in her R upper arm/shoulder and so PT has added in some exercises for her R shoulder/upper arm.  She is also noting some concerns regarding her balance.  She also notes that she was recently diagnosed with osteoporosis via bone density test.  She has not started on treatment yet.  Diagnostic testing: L shoulder XR- 03/02/21  Pertinent review of systems: No fevers or chills.  Relevant historical information: Lupus Osteoporosis Familial hyperlipidemia  Exam:  BP 110/64 (BP Location: Left Arm, Patient Position: Sitting, Cuff Size: Normal)    Pulse 90    Ht 5\' 4"  (1.626 m)    Wt 158 lb 6.4 oz (71.8 kg)    SpO2 97%    BMI 27.19 kg/m  General: Well Developed, well nourished, and in no acute distress.   MSK:  C-spine: Normal-appearing Nontender. Decreased cervical motion.  Right shoulder: Normal appearing. Normal motion Some pain with abduction. Intact strength. Mildly positive Hawkins and Neer's test.  Left shoulder normal-appearing Normal motion. Some pain with abduction. Intact strength. Mildly positive Hawkins and Neer's test.   Lab and Radiology Results  X-ray images right shoulder obtained today personally and independently interpreted Mild glenohumeral DJD.  No acute fractures. Await formal radiology review   EXAM: MRI CERVICAL SPINE WITHOUT CONTRAST  TECHNIQUE: Multiplanar, multisequence MR imaging of the cervical spine  was performed. No intravenous contrast was administered.  COMPARISON: Prior MRI from 06/10/2015.  FINDINGS: Alignment: Straightening of the normal cervical lordosis. Trace anterolisthesis of C3 on C4 and C7 on T1.  Vertebrae: Prior ACDF at C5-C7. Vertebral body height maintained without acute or chronic fracture. Bone marrow signal intensity within normal limits. No discrete or worrisome osseous lesions or abnormal marrow edema.  Cord: Normal signal and morphology.  Posterior Fossa, vertebral arteries, paraspinal tissues: Visualized brain and posterior fossa within normal limits. Craniocervical junction normal. Paraspinous and prevertebral soft tissues within normal limits. Normal intravascular flow voids seen within the vertebral arteries bilaterally.  Disc levels:  C2-C3: Negative interspace. Moderate right-sided facet hypertrophy. No spinal stenosis. Mild right C3 foraminal narrowing.  C3-C4: Minimal annular disc bulge with bilateral uncovertebral hypertrophy. Bilateral facet degeneration. No spinal stenosis. Mild right greater than left C4 foraminal stenosis.  C4-C5: Minimal annular disc bulge with right-sided uncovertebral hypertrophy. Mild bilateral facet hypertrophy. No spinal stenosis. Mild right C5 foraminal narrowing. Left neural foramina remains patent.  C5-C6: Prior fusion. No residual spinal stenosis. Right-sided uncovertebral hypertrophy with residual mild to moderate right C6 foraminal narrowing.  C6-C7: Prior fusion. No residual spinal stenosis. Foramina remain patent.  C7-T1: Negative interspace. Mild facet hypertrophy. No canal or foraminal stenosis.  Visualized upper thoracic spine demonstrates no significant finding.  IMPRESSION: 1. Prior ACDF at C5-C7 without residual spinal stenosis. Residual right-sided uncovertebral hypertrophy at C5-6 with resultant mild to moderate right C6 foraminal narrowing. 2. Mild spondylosis at C3-4 and C4-5 with  resultant mild bilateral C4 and right C5 foraminal  stenosis. 3. Prominent right-sided facet hypertrophy at C2-3 with resultant mild right C3 foraminal stenosis.   Electronically Signed By: [name] M.D. On: 05/27/2021 05:27  I, Ashley Mathews, personally (independently) visualized and performed the interpretation of the images attached in this note.      Assessment and Plan: 61 y.o. female with  Bilateral shoulder pain.  Multifactorial.  I think some of her pain is true shoulder rotator cuff related pain that is improving with physical therapy.  It appears that some of her pain could be cervical radiculopathy based on the results of her cervical MRI obtained February 11 with her neurosurgeon.  Right C6 seems to be the worst nerve impingement.  Additionally she notes a new issue where she is noticing some impaired balance.  She notes this combined with her new diagnosis of osteoporosis is concerning.  Physical therapy offered to start vestibular therapy.  I think this is a great idea and will place referral now.   She also mentions osteoporosis is a new diagnosis with her primary care provider. Recommend weightbearing exercise and vitamin D.  Recommend against high-dose calcium given her familial hyperlipidemia. I think Prolia is a good choice.  Recommend that she get me her bone density test results so that I can work on authorization for [employer].  Follow-up in 6 weeks.  PDMP not reviewed this encounter. Orders Placed This Encounter  Procedures   DG Shoulder Right    Standing Status:   Future    Number of Occurrences:   1    Standing Expiration Date:   06/29/2021    Order Specific Question:   Reason for Exam (SYMPTOM  OR DIAGNOSIS REQUIRED)    Answer:   R shoulder pain    Order Specific Question:   Preferred imaging location?    Answer:   [facility]   Ambulatory referral to Physical Therapy    Referral Priority:   Routine    Referral Type:   Physical Medicine     Referral Reason:   Specialty Services Required    Requested Specialty:   Physical Therapy    Number of Visits Requested:   1   No orders of the defined types were placed in this encounter.    Discussed warning signs or symptoms. Please see discharge instructions. Patient expresses understanding.   The above documentation has been reviewed and is accurate and complete Ashley Mathews, M.D.

## 2021-06-01 ENCOUNTER — Ambulatory Visit: Payer: Self-pay

## 2021-06-01 ENCOUNTER — Encounter: Payer: Self-pay | Admitting: Family Medicine

## 2021-06-01 ENCOUNTER — Ambulatory Visit: Payer: 59 | Admitting: Family Medicine

## 2021-06-01 ENCOUNTER — Ambulatory Visit (INDEPENDENT_AMBULATORY_CARE_PROVIDER_SITE_OTHER): Payer: 59

## 2021-06-01 ENCOUNTER — Other Ambulatory Visit: Payer: Self-pay

## 2021-06-01 VITALS — BP 110/64 | HR 90 | Ht 64.0 in | Wt 158.4 lb

## 2021-06-01 DIAGNOSIS — M25511 Pain in right shoulder: Secondary | ICD-10-CM | POA: Diagnosis not present

## 2021-06-01 DIAGNOSIS — M25512 Pain in left shoulder: Secondary | ICD-10-CM | POA: Diagnosis not present

## 2021-06-01 DIAGNOSIS — E7849 Other hyperlipidemia: Secondary | ICD-10-CM

## 2021-06-01 DIAGNOSIS — G8929 Other chronic pain: Secondary | ICD-10-CM | POA: Diagnosis not present

## 2021-06-01 DIAGNOSIS — M810 Age-related osteoporosis without current pathological fracture: Secondary | ICD-10-CM

## 2021-06-01 DIAGNOSIS — R2689 Other abnormalities of gait and mobility: Secondary | ICD-10-CM

## 2021-06-01 NOTE — Patient Instructions (Addendum)
Good to see you today.  Con't PT.  Have sent a new PT referral to include R and L shoulder and balance disturbance.  Follow-up: 6 weeks

## 2021-06-02 NOTE — Progress Notes (Signed)
Right shoulder x-ray shows mild arthritis

## 2021-07-12 NOTE — Progress Notes (Signed)
? ?I, [name], LAT, ATC, am serving as scribe for Dr. [name]. ? ?Ashley Mathews is a 61 y.o. female who presents to [facility] at [facility] today for f/u of L shoulder pain most consistent with rotator cuff dysfunction or impingement.  She was last seen by Dr. [name] on 06/01/21 and noted 40-50% improvement.  She was advised to con't at [facility] and new referral was placed to include B shoulder pain and balance.  Today, pt reports her L shoulder is doing great, but the R is still pretty painful. Pt notes she has been working on her R shoulder at PT. Pt reports over the weekend she tried to reach into the back seat of her car and hurt her R shoulder. ? ?She also has osteoporosis recently diagnosed by her primary care provider.  She is interested in Prolia. ? ?Diagnostic testing: Cervical MRI done through neurosurgery- 05/27/21; L shoulder XR- 03/02/21 ? ?Pertinent review of systems: No fevers or chills ? ?Relevant historical information: Osteoporosis.  History of lupus ? ? ?Exam:  ?BP 122/80   Pulse 81   Ht 5\' 4"  (1.626 m)   Wt 160 lb (72.6 kg)   SpO2 99%   BMI 27.46 kg/m?  ?General: Well Developed, well nourished, and in no acute distress.  ? ?MSK: Shoulder normal-appearing ?Nontender. ?Range of motion abduction intact pain with abduction.  Internal rotation limited to lumbar spine external rotation full. ?Positive Hawkins and Neer's test.  Positive empty can test. ?Strength 4/5 abduction 5/5 external and internal rotation. ? ? ? ?Lab and Radiology Results ? ?Procedure: Real-time Ultrasound Guided Injection of right shoulder subacromial bursa ?Device: Philips Affiniti 50G ?Images permanently stored and available for review in PACS ?Ultrasound evaluation prior to injection reveals moderate subacromial bursitis. ?Verbal informed consent obtained.  Discussed risks and benefits of procedure. Warned about infection bleeding damage to structures skin hypopigmentation and fat  atrophy among others. ?Patient expresses understanding and agreement ?Time-out conducted.   ?Noted no overlying erythema, induration, or other signs of local infection.   ?Skin prepped in a sterile fashion.   ?Local anesthesia: Topical Ethyl chloride.   ?With sterile technique and under real time ultrasound guidance: 40 mg of Kenalog and 2 mL of Marcaine injected into subacromial bursa. Fluid seen entering the bursa.   ?Completed without difficulty   ?Pain immediately resolved suggesting accurate placement of the medication.   ?Advised to call if fevers/chills, erythema, induration, drainage, or persistent bleeding.   ?Images permanently stored and available for review in the ultrasound unit.  ?Impression: Technically successful ultrasound guided injection. ? ? ?EXAM: ?RIGHT SHOULDER - 2+ VIEW ?  ?COMPARISON:  None. ?  ?FINDINGS: ?Frontal, transscapular, and axillary views are obtained. No ?fracture, subluxation, or dislocation. Mild glenohumeral and ?acromioclavicular joint osteoarthritis. Visualized portions of the ?right chest are clear. ?  ?IMPRESSION: ?1. Mild osteoarthritis.  No acute bony abnormality. ?  ?  ?Electronically Signed ?  By: M.D. ?  On: 06/01/2021 20:17 ?I, 06/03/2021, personally (independently) visualized and performed the interpretation of the images attached in this note. ? ? ? ? ? ?Assessment and Plan: ?60 y.o. female with right shoulder pain.  Thought to be due primarily to rotator cuff tendinopathy/impingement/bursitis.  She has improved with physical therapy but still not doing well enough.  The left shoulder pain has almost completely resolved with PT.  After discussion plan for subacromial injection today.  If this is not sufficient we will consider  MRI.  Recheck this issue in 2 months. ? ?Osteoporosis.  Discussed management strategies.  We will request medical records from her PCP office to get a copy of the bone density test so that we can authorize Prolia. ? ? ?PDMP not  reviewed this encounter. ?Orders Placed This Encounter  ?Procedures  ? [place] LIMITED JOINT SPACE STRUCTURES UP RIGHT(NO LINKED CHARGES)  ?  Order Specific Question:   Reason for Exam (SYMPTOM  OR DIAGNOSIS REQUIRED)  ?  Answer:   right shoulder pain  ?  Order Specific Question:   Preferred imaging location?  ?  Answer:   [facility]  ? ?No orders of the defined types were placed in this encounter. ? ? ? ?Discussed warning signs or symptoms. Please see discharge instructions. Patient expresses understanding. ? ? ?The above documentation has been reviewed and is accurate and complete [name], M.D. ? ? ?

## 2021-07-13 ENCOUNTER — Ambulatory Visit: Payer: 59 | Admitting: Family Medicine

## 2021-07-13 ENCOUNTER — Ambulatory Visit: Payer: Self-pay

## 2021-07-13 VITALS — BP 122/80 | HR 81 | Ht 64.0 in | Wt 160.0 lb

## 2021-07-13 DIAGNOSIS — M25511 Pain in right shoulder: Secondary | ICD-10-CM | POA: Diagnosis not present

## 2021-07-13 DIAGNOSIS — G8929 Other chronic pain: Secondary | ICD-10-CM

## 2021-07-13 DIAGNOSIS — M810 Age-related osteoporosis without current pathological fracture: Secondary | ICD-10-CM

## 2021-07-13 NOTE — Patient Instructions (Addendum)
Thank you for coming in today.  ? ?You received an injection today. Seek immediate medical attention if the joint becomes red, extremely painful, or is oozing fluid.  ? ?Recheck back in 2 months or once the Prolia is approved ?

## 2021-09-08 NOTE — Progress Notes (Unsigned)
   I, [name], LAT, ATC, am serving as scribe for Dr. [name].  Ashley Mathews is a 61 y.o. female who presents to [facility] at [facility] today for f/u of B shoulder pain.  She was last seen by Dr. [name] on 07/13/21 w/ reports of R shoulder pain and f/u on L shoulder pain that she said was doing great.  She had a R shoulder subacromial steroid injection.  She has completed a prior course of PT for her B shoulders.  Today, pt reports R shoulder has been feeling great since the steroid injection. Pt notes pain when first waking up in the mornings. Pt reports L shoulder pain resolved after doing PT.  Diagnostic testing: R shoulder XR- 06/01/21; Cervical MRI done through neurosurgery- 05/27/21; L shoulder XR- 03/02/21  Pertinent review of systems: No fevers or chills  Relevant historical information: Fibromyalgia.  Sleep apnea.   Exam:  BP 130/84   Pulse 81   Ht 5\' 4"  (1.626 m)   Wt 161 lb 3.2 oz (73.1 kg)   SpO2 99%   BMI 27.67 kg/m  General: Well Developed, well nourished, and in no acute distress.   MSK: Bilateral shoulders normal-appearing normal motion.       Assessment and Plan: 61 y.o. female with bilateral shoulder pain right worse than left.  Much improved following steroid injection and PT.  Watchful waiting.  Continue home exercise program and recheck back as needed.  Presumed osteoporosis.  We will plan on Prolia.  We do not have medical records yet from her PCP office.  Requested him at the last visit and I am not sure what happened.  She thinks she can find the records in her portal from her PCP office.  She will send records to me or get them to her directly herself.  Should be able to see the bone density test and then order labs and get Prolia approved.    Discussed warning signs or symptoms. Please see discharge instructions. Patient expresses understanding.   The above documentation has been reviewed and is accurate and complete 67, M.D.

## 2021-09-12 ENCOUNTER — Ambulatory Visit: Payer: 59 | Admitting: Family Medicine

## 2021-09-12 VITALS — BP 130/84 | HR 81 | Ht 64.0 in | Wt 161.2 lb

## 2021-09-12 DIAGNOSIS — G8929 Other chronic pain: Secondary | ICD-10-CM | POA: Diagnosis not present

## 2021-09-12 DIAGNOSIS — M25511 Pain in right shoulder: Secondary | ICD-10-CM | POA: Diagnosis not present

## 2021-09-12 DIAGNOSIS — M810 Age-related osteoporosis without current pathological fracture: Secondary | ICD-10-CM

## 2021-09-12 NOTE — Patient Instructions (Addendum)
Thank you for coming in today.   So glad your shoulders are feeling better!  Check back as needed

## 2022-01-12 ENCOUNTER — Telehealth: Payer: Self-pay | Admitting: Family Medicine

## 2022-01-12 DIAGNOSIS — E559 Vitamin D deficiency, unspecified: Secondary | ICD-10-CM

## 2022-01-12 DIAGNOSIS — M899 Disorder of bone, unspecified: Secondary | ICD-10-CM

## 2022-01-12 DIAGNOSIS — M810 Age-related osteoporosis without current pathological fracture: Secondary | ICD-10-CM

## 2022-01-12 NOTE — Telephone Encounter (Signed)
We received the bone density report which does show osteoporosis.  We will proceed with Prolia as we discussed. To administer Prolia safely we will need to check labs within 6 weeks of Prolia  Ordered labs.  If he would like [place] to proceed to Prolia please let us know and we will order the medication and you can swing by and get labs done.

## 2022-01-15 NOTE — Telephone Encounter (Addendum)
Confirming benefits with Joycelyn Schmid (Prolia Rep). There is some confusion with the correct information for coverage.

## 2022-01-24 NOTE — Telephone Encounter (Signed)
Due to high deductible, pt would benefit from the [place].  (https://www.[email or web address]/copay) She would need to enroll to be approved and then we can order the Prolia. In past use, patient's benefit cost wise from [place] sending it to the pharmacy and them picking it up there.  She would receive a preloaded card that she would use to pay the cost of Prolia and administration.  Left message for patient to call back to discuss.

## 2022-02-13 NOTE — Telephone Encounter (Signed)
Appointment scheduled with Dr Georgina Snell to discuss further treatment

## 2022-02-13 NOTE — Progress Notes (Unsigned)
   I, [name], LAT, ATC acting as a scribe for [name], MD.  Ashley Mathews is a 61 y.o. female who presents to [facility] today for L 5th toe pain. Pt was previously seen by Dr. Georgina Snell on 09/12/21 for chronic R shoulder pain. Today, pt report injuring her L toe on ?? Pt locates pain to   L foot swelling: Treatments tried:   Pertinent review of systems: ***  Relevant historical information: ***   Exam:  There were no vitals taken for this visit. General: Well Developed, well nourished, and in no acute distress.   MSK: ***    Lab and Radiology Results No results found for this or any previous visit (from the past 72 hour(s)). No results found.     Assessment and Plan: 61 y.o. female with ***   PDMP not reviewed this encounter. No orders of the defined types were placed in this encounter.  No orders of the defined types were placed in this encounter.    Discussed warning signs or symptoms. Please see discharge instructions. Patient expresses understanding.   ***

## 2022-02-14 ENCOUNTER — Ambulatory Visit: Payer: Self-pay

## 2022-02-14 ENCOUNTER — Ambulatory Visit (INDEPENDENT_AMBULATORY_CARE_PROVIDER_SITE_OTHER): Payer: 59

## 2022-02-14 ENCOUNTER — Ambulatory Visit: Payer: 59 | Admitting: Family Medicine

## 2022-02-14 VITALS — Ht 64.0 in | Wt 156.0 lb

## 2022-02-14 DIAGNOSIS — S92515A Nondisplaced fracture of proximal phalanx of left lesser toe(s), initial encounter for closed fracture: Secondary | ICD-10-CM

## 2022-02-14 DIAGNOSIS — M810 Age-related osteoporosis without current pathological fracture: Secondary | ICD-10-CM

## 2022-02-14 DIAGNOSIS — M25572 Pain in left ankle and joints of left foot: Secondary | ICD-10-CM | POA: Diagnosis not present

## 2022-02-14 NOTE — Patient Instructions (Signed)
Thank you for coming in today.   Here is what we found out about prolia.   Due to high deductible, pt would benefit from the [place].  (https://www.[email or web address]/copay) She would need to enroll to be approved and then we can order the Prolia. In past use, patient's benefit cost wise from [place] sending it to the pharmacy and them picking it up there.  She would receive a preloaded card that she would use to pay the cost of Prolia and administration.  We will need to get labs prior to prolia.  We typically will do it about 1 week prior to the injection.   For the toe fracture use a post op show for a few weeks the wean into a normal shoe if you can.  Let me know.

## 2022-02-19 NOTE — Progress Notes (Signed)
Left foot x-ray shows a broken fifth toe like we talked about.

## 2022-06-04 ENCOUNTER — Encounter: Payer: Self-pay | Admitting: *Deleted

## 2022-07-02 ENCOUNTER — Emergency Department (HOSPITAL_COMMUNITY)
Admission: EM | Admit: 2022-07-02 | Discharge: 2022-07-02 | Disposition: A | Discharge status: Home / Self Care | Payer: 59 | Attending: Emergency Medicine | Admitting: Emergency Medicine

## 2022-07-02 ENCOUNTER — Other Ambulatory Visit: Payer: Self-pay

## 2022-07-02 ENCOUNTER — Encounter (HOSPITAL_COMMUNITY): Payer: Self-pay

## 2022-07-02 ENCOUNTER — Emergency Department (HOSPITAL_COMMUNITY): Payer: 59

## 2022-07-02 DIAGNOSIS — N3289 Other specified disorders of bladder: Secondary | ICD-10-CM | POA: Diagnosis not present

## 2022-07-02 DIAGNOSIS — J45909 Unspecified asthma, uncomplicated: Secondary | ICD-10-CM | POA: Diagnosis not present

## 2022-07-02 DIAGNOSIS — R319 Hematuria, unspecified: Secondary | ICD-10-CM | POA: Diagnosis not present

## 2022-07-02 LAB — COMPREHENSIVE METABOLIC PANEL
ALT: 32 U/L (ref 0–44)
AST: 27 U/L (ref 15–41)
Albumin: 4.2 g/dL (ref 3.5–5.0)
Alkaline Phosphatase: 64 U/L (ref 38–126)
Anion gap: 8 (ref 5–15)
BUN: 18 mg/dL (ref 8–23)
CO2: 23 mmol/L (ref 22–32)
Calcium: 9.2 mg/dL (ref 8.9–10.3)
Chloride: 107 mmol/L (ref 98–111)
Creatinine, Ser: 0.87 mg/dL (ref 0.44–1.00)
GFR, Estimated: >60 mL/min (ref >60)
Glucose, Bld: 100 mg/dL — ABNORMAL HIGH (ref 70–99)
Potassium: 3.8 mmol/L (ref 3.5–5.1)
Sodium: 138 mmol/L (ref 135–145)
Total Bilirubin: 0.8 mg/dL (ref 0.3–1.2)
Total Protein: 7 g/dL (ref 6.5–8.1)

## 2022-07-02 LAB — URINALYSIS, W/ REFLEX TO CULTURE (INFECTION SUSPECTED)
Bacteria, UA: NONE SEEN
Bilirubin Urine: NEGATIVE
Glucose, UA: NEGATIVE mg/dL
Ketones, ur: NEGATIVE mg/dL
Nitrite: NEGATIVE
Protein, ur: NEGATIVE mg/dL
RBC / HPF: >50 RBC/hpf (ref 0–5)
Specific Gravity, Urine: 1.006 (ref 1.005–1.030)
WBC, UA: >50 WBC/hpf (ref 0–5)
pH: 8 (ref 5.0–8.0)

## 2022-07-02 LAB — CBC WITH DIFFERENTIAL/PLATELET
Abs Immature Granulocytes: 0.04 10*3/uL (ref 0.00–0.07)
Basophils Absolute: 0.1 10*3/uL (ref 0.0–0.1)
Basophils Relative: 1 %
Eosinophils Absolute: 0.2 10*3/uL (ref 0.0–0.5)
Eosinophils Relative: 2 %
HCT: 37.9 % (ref 36.0–46.0)
Hemoglobin: 13.1 g/dL (ref 12.0–15.0)
Immature Granulocytes: 0 %
Lymphocytes Relative: 19 %
Lymphs Abs: 1.9 10*3/uL (ref 0.7–4.0)
MCH: 28.9 pg (ref 26.0–34.0)
MCHC: 34.6 g/dL (ref 30.0–36.0)
MCV: 83.7 fL (ref 80.0–100.0)
Monocytes Absolute: 0.7 10*3/uL (ref 0.1–1.0)
Monocytes Relative: 7 %
Neutro Abs: 7.3 10*3/uL (ref 1.7–7.7)
Neutrophils Relative %: 71 %
Platelets: 226 10*3/uL (ref 150–400)
RBC: 4.53 MIL/uL (ref 3.87–5.11)
RDW: 11.8 % (ref 11.5–15.5)
WBC: 10.1 10*3/uL (ref 4.0–10.5)
nRBC: 0 % (ref 0.0–0.2)

## 2022-07-02 LAB — LIPASE, BLOOD: Lipase: 41 U/L (ref 11–51)

## 2022-07-02 MED ORDER — SODIUM CHLORIDE 0.9 % IV BOLUS
1000.0000 mL | Freq: Once | INTRAVENOUS | Status: AC
  Administered 2022-07-02: 1000 mL via INTRAVENOUS

## 2022-07-02 MED ORDER — OXYBUTYNIN CHLORIDE ER 10 MG PO TB24: 10.0000 mg | ORAL_TABLET | Freq: Every day | ORAL | 0 refills | Status: AC

## 2022-07-02 MED ORDER — IOHEXOL 300 MG/ML  SOLN
100.0000 mL | Freq: Once | INTRAMUSCULAR | Status: AC | PRN
  Administered 2022-07-02: 100 mL via INTRAVENOUS

## 2022-07-02 MED ORDER — ONDANSETRON HCL 4 MG/2ML IJ SOLN: 4.0000 mg | Freq: Once | INTRAMUSCULAR | Status: DC

## 2022-07-02 MED ORDER — SODIUM CHLORIDE 0.9 % IV SOLN
25.0000 mg | Freq: Once | INTRAVENOUS | Status: AC
  Administered 2022-07-02: 25 mg via INTRAVENOUS
  Filled 2022-07-02: qty 25

## 2022-07-02 MED ORDER — SODIUM CHLORIDE (PF) 0.9 % IJ SOLN
INTRAMUSCULAR | Status: AC
  Filled 2022-07-02: qty 50

## 2022-07-02 MED ORDER — OXYCODONE HCL 5 MG PO TABS: 5.0000 mg | ORAL_TABLET | Freq: Four times a day (QID) | ORAL | 0 refills | Status: DC | PRN

## 2022-07-02 MED ORDER — PROMETHAZINE HCL 25 MG PO TABS: 25.0000 mg | ORAL_TABLET | Freq: Three times a day (TID) | ORAL | 0 refills | Status: DC | PRN

## 2022-07-02 MED ORDER — KETOROLAC TROMETHAMINE 15 MG/ML IJ SOLN
15.0000 mg | Freq: Once | INTRAMUSCULAR | Status: AC
  Administered 2022-07-02: 15 mg via INTRAVENOUS
  Filled 2022-07-02: qty 1

## 2022-07-02 MED ORDER — DIPHENHYDRAMINE HCL 50 MG/ML IJ SOLN
12.5000 mg | Freq: Once | INTRAMUSCULAR | Status: AC
  Administered 2022-07-02: 12.5 mg via INTRAVENOUS
  Filled 2022-07-02: qty 1

## 2022-07-02 NOTE — ED Triage Notes (Signed)
Bilateral flank pain radiating through to lower abd with hematuria, nausea, and burning with urination x1 day.

## 2022-07-02 NOTE — Discharge Instructions (Addendum)
It was a pleasure caring for you today in the emergency department.  Please return to the emergency department for any worsening or worrisome symptoms.  Please call urology to set up follow-up appointment within next 2 weeks

## 2022-07-02 NOTE — ED Provider Notes (Signed)
[place] EMERGENCY DEPARTMENT AT [facility] Provider Note  CSN: [id number] Arrival date & time: 07/02/22 1014  Chief Complaint(s) Flank Pain and Hematuria  HPI Ashley Mathews is a 62 y.o. female with past medical history as below, significant for IBS, HLD, GERD, fibromyalgia, chronic headaches who presents to the ED with complaint of abdominal pain, dysuria.  Patient with dysuria, urgency, "bladder spasms" over the past 24 hours, thought she was developing a UTI.  Noticed some blood in her urine this morning in conjunction with worsening urgency and suprapubic discomfort.  Pain is radiating to her left flank.  Denies history of kidney stone in the past.  Having nausea without vomiting. - Fevers.  No chest pain or dyspnea.  No rashes.  No abnormal vaginal bleeding or discharge.  No change in her typical bowel habits.  No melena or BRBPR.  No trauma. took Excedrin this morning without significant relief of her symptoms  Past Medical History Past Medical History:  Diagnosis Date   Allergic rhinitis    Asthma    Colon polyps    Depression    Fibromyalgia    Gallstones    Headache, chronic daily    HLD (hyperlipidemia)    IBS (irritable bowel syndrome)    Lupus ([place])    Sleep apnea    Thyroid disorder    Patient Active Problem List   Diagnosis Date Noted   Osteoporosis 06/01/2021   Hypersomnia 06/14/2017   Obstructive sleep apnea 06/14/2017   Knee pain, bilateral 12/22/2015   Palpitations 05/13/2013   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   Dyspnea 08/04/2012   Plantar fasciitis 02/20/2012   OTITIS MEDIA, CHRONIC 07/09/2008   GERD 07/09/2008   ALLERGIC RHINITIS 05/04/2008   ASTHMA 05/04/2008   LUPUS (SLE) 05/04/2008   FIBROMYALGIA 05/04/2008   HEADACHE, CHRONIC 05/04/2008   COUGH 05/04/2008   Home Medication(s) Prior to Admission medications   Medication Sig Start Date End Date Taking? Authorizing Provider  oxybutynin (DITROPAN XL) 10 MG 24 hr tablet Take 1  tablet (10 mg total) by mouth at bedtime for 10 days. 07/02/22 07/12/22 Yes Wynona Dove A, DO  oxyCODONE (ROXICODONE) 5 MG immediate release tablet Take 1 tablet (5 mg total) by mouth every 6 (six) hours as needed for severe pain. 07/02/22  Yes Jeanell Sparrow, DO  promethazine (PHENERGAN) 25 MG tablet Take 1 tablet (25 mg total) by mouth every 8 (eight) hours as needed for nausea or vomiting. 07/02/22  Yes Wynona Dove A, DO  Bempedoic Acid (NEXLETOL) 180 MG TABS Take by mouth.    [provider]  cetirizine (ZYRTEC) 10 MG tablet Take 10 mg by mouth daily.    [provider]  chlorpheniramine-HYDROcodone (TUSSIONEX) 10-8 MG/5ML SUER TAKE 5ML BY MOUTH EVERY 12 HOURS prn 05/09/17   [provider]  Cholecalciferol (VITAMIN D3) 5000 units CAPS Take 1 capsule by mouth daily.    [provider]  diphenhydrAMINE (BENADRYL) 25 MG tablet Take 25 mg by mouth every 6 (six) hours as needed.    [provider]  famotidine (PEPCID) 20 MG tablet Take 20 mg by mouth 2 (two) times daily. OTC    [provider]  imipramine (TOFRANIL) 25 MG tablet Take 25 mg by mouth at bedtime.     [provider]  KLOR-CON 10 10 MEQ tablet  11/30/15   [provider]  levothyroxine (SYNTHROID, LEVOTHROID) 100 MCG tablet Take 100 mcg by mouth daily. Monday-Friday 04/20/09   [provider]  metaxalone (SKELAXIN) 800 MG tablet  12/21/15   [provider]  modafinil (PROVIGIL) 200 MG tablet Take 1 tablet (200 mg total) by mouth daily. 04/25/20   Lomax, Amy, NP  mupirocin ointment (BACTROBAN) 2 %  11/16/15   [provider]  NAPROXEN SODIUM PO Take 220 mg by mouth as needed.    [provider]  Olopatadine HCl 0.2 % SOLN  03/29/10   [provider]  sertraline (ZOLOFT) 100 MG tablet Take 100 mg by mouth daily. 05/31/18   [provider]                                                                                                                                     Past Surgical History Past Surgical History:  Procedure Laterality Date   CERVICAL FUSION  11/2015   CESAREAN SECTION  1987   CHOLECYSTECTOMY  2011   LAPAROSCOPIC ENDOMETRIOSIS FULGURATION     MYRINGOTOMY     PARTIAL HYSTERECTOMY  1999   Family History Family History  Problem Relation Age of Onset   Heart disease Mother    Hypertension Mother    Stroke Mother    Allergic rhinitis Mother    Scleroderma Mother    Colon polyps Mother    Diabetes Mother    Irritable bowel syndrome Mother    Diverticulitis Mother    Heart attack Maternal Grandfather    Heart murmur Brother    Lung cancer Maternal Aunt    Heart disease Maternal Aunt    Lung cancer Maternal Uncle    Heart disease Maternal Uncle    Heart disease Maternal Uncle    Heart disease Maternal Uncle    Diabetes Maternal Uncle     Social History Social History   Tobacco Use   Smoking status: Never   Smokeless tobacco: Never  Substance Use Topics   Alcohol use: Yes    Comment: <1 daily   Drug use: No   Allergies Sulfa antibiotics, Sulfonamide derivatives, Amoxicillin, Amoxicillin-pot clavulanate, Cephalexin, Ciprofloxacin, Clarithromycin, Doxycycline, Erythromycin, Indomethacin, Iodine, Lidocaine, Penicillins, Xylocaine  [lidocaine hcl], Zithromax [azithromycin], and Zofran [ondansetron hcl]  Review of Systems Review of Systems  Constitutional:  Negative for activity change and fever.  HENT:  Negative for facial swelling and trouble swallowing.   Eyes:  Negative for discharge and redness.  Respiratory:  Negative for cough and shortness of breath.   Cardiovascular:  Negative for chest pain and palpitations.  Gastrointestinal:  Positive for abdominal pain and nausea. Negative for vomiting.  Genitourinary:  Positive for flank pain, frequency, hematuria and urgency. Negative for dysuria.  Musculoskeletal:  Negative for back pain and gait problem.  Skin:  Negative for  pallor and rash.  Neurological:  Negative for syncope and headaches.    Physical Exam Vital Signs  I have reviewed the triage vital signs BP (!) 144/66   Pulse 92   Temp 98 F (36.7  C) (Oral)   Resp 14   Wt 70 kg   SpO2 100%   BMI 26.49 kg/m  Physical Exam Vitals and nursing note reviewed.  Constitutional:      General: She is not in acute distress.    Appearance: Normal appearance. She is not ill-appearing.  HENT:     Head: Normocephalic and atraumatic.     Right Ear: External ear normal.     Left Ear: External ear normal.     Nose: Nose normal.     Mouth/Throat:     Mouth: Mucous membranes are moist.  Eyes:     General: No scleral icterus.       Right eye: No discharge.        Left eye: No discharge.  Cardiovascular:     Rate and Rhythm: Normal rate and regular rhythm.     Pulses: Normal pulses.     Heart sounds: Normal heart sounds.  Pulmonary:     Effort: Pulmonary effort is normal. No respiratory distress.     Breath sounds: Normal breath sounds.  Abdominal:     General: Abdomen is flat.     Palpations: Abdomen is soft.     Tenderness: There is abdominal tenderness. There is no guarding or rebound.    Musculoskeletal:     Right lower leg: No edema.     Left lower leg: No edema.  Skin:    General: Skin is warm and dry.     Capillary Refill: Capillary refill takes less than 2 seconds.  Neurological:     Mental Status: She is alert.  Psychiatric:        Mood and Affect: Mood normal.        Behavior: Behavior normal.     ED Results and Treatments Labs (all labs ordered are listed, but only abnormal results are displayed) Labs Reviewed  COMPREHENSIVE METABOLIC PANEL - Abnormal; Notable for the following components:      Result Value   Glucose, Bld 100 (*)    All other components within normal limits  URINALYSIS, W/ REFLEX TO CULTURE (INFECTION SUSPECTED) - Abnormal; Notable for the following components:   APPearance CLOUDY (*)    Hgb urine dipstick  LARGE (*)    Leukocytes,Ua MODERATE (*)    All other components within normal limits  URINE CULTURE  CBC WITH DIFFERENTIAL/PLATELET  LIPASE, BLOOD                                                                                                                          Radiology CT ABDOMEN PELVIS W CONTRAST  Result Date: 07/02/2022 CLINICAL DATA:  Abdominal pain, acute, nonlocalized suprapubic, left flank EXAM: CT ABDOMEN AND PELVIS WITH CONTRAST TECHNIQUE: Multidetector CT imaging of the abdomen and pelvis was performed using the standard protocol following bolus administration of intravenous contrast. RADIATION DOSE REDUCTION: This exam was performed according to the departmental dose-optimization program which includes automated exposure control, adjustment of the mA and/or  kV according to patient size and/or use of iterative reconstruction technique. CONTRAST:  130mL OMNIPAQUE IOHEXOL 300 MG/ML  SOLN COMPARISON:  06/30/2018 FINDINGS: Lower chest: No acute abnormality. Hepatobiliary: No focal liver abnormality is seen. Status post cholecystectomy. No biliary dilatation. Pancreas: Unremarkable. No pancreatic ductal dilatation or surrounding inflammatory changes. Spleen: Normal in size without focal abnormality. Adrenals/Urinary Tract: Unremarkable adrenal glands. Kidneys enhance symmetrically without focal lesion, stone, or hydronephrosis. Ureters are nondilated. Urinary bladder appears unremarkable for the degree of distention. Stomach/Bowel: Stomach is within normal limits. Appendix appears normal. No evidence of bowel wall thickening, distention, or inflammatory changes. Vascular/Lymphatic: Scattered aortoiliac atherosclerotic calcifications without aneurysm. No abdominopelvic lymphadenopathy. Reproductive: Status post hysterectomy. No adnexal masses. Other: No free fluid. No abdominopelvic fluid collection. No pneumoperitoneum. No abdominal wall hernia. Musculoskeletal: No acute or significant osseous  findings. IMPRESSION: No acute abdominopelvic findings. Specifically, no evidence of obstructive uropathy. Aortic Atherosclerosis (ICD10-I70.0). Electronically Signed   By: Davina Poke D.O.   On: 07/02/2022 13:40    Pertinent labs & imaging results that were available during my care of the patient were reviewed by me and considered in my medical decision making (see MDM for details).  Medications Ordered in ED Medications  sodium chloride 0.9 % bolus 1,000 mL (0 mLs Intravenous Stopped 07/02/22 1218)  ketorolac (TORADOL) 15 MG/ML injection 15 mg (15 mg Intravenous Given 07/02/22 1136)  promethazine (PHENERGAN) 25 mg in sodium chloride 0.9 % 50 mL IVPB (0 mg Intravenous Stopped 07/02/22 1154)  diphenhydrAMINE (BENADRYL) injection 12.5 mg (12.5 mg Intravenous Given 07/02/22 1136)  iohexol (OMNIPAQUE) 300 MG/ML solution 100 mL (100 mLs Intravenous Contrast Given 07/02/22 1313)  sodium chloride (PF) 0.9 % injection (  Given by Other 07/02/22 1326)                                                                                                                                     Procedures Procedures  (including critical care time)  Medical Decision Making / ED Course    Medical Decision Making:    ELLIANAH BRIDGE is a 62 y.o. female  with past medical history as below, significant for IBS, HLD, GERD, fibromyalgia, chronic headaches who presents to the ED with complaint of abdominal pain, dysuria.. The complaint involves an extensive differential diagnosis and also carries with it a high risk of complications and morbidity.  Serious etiology was considered. Ddx includes but is not limited to: Differential diagnosis includes but is not exclusive to ectopic pregnancy, ovarian cyst, ovarian torsion, acute appendicitis, urinary tract infection, endometriosis, bowel obstruction, hernia, colitis, renal colic, gastroenteritis, volvulus etc.   Complete initial physical exam performed, notably the  patient  was NAD, resting comfortably, abdomen is soft, not peritoneal.    Reviewed and confirmed nursing documentation for past medical history, family history, social history.  Vital signs reviewed.    Clinical Course as of 07/03/22 0714  Mon Jul 02, 2022  1458 Feeling better, labs/imaging reviewed; stable. UA pending.  [SG]    Clinical Course User Index [SG] Wynona Dove A, DO   CT reviewed and stable.  Labs stable, blood noted in urine on sample.  No bacteria.  Hemoglobin stable.  Symptoms greatly improved.  Unclear etiology of her hematuria.  Possibly recently passed kidney stone given near resolution of pain and rapid improvement of bleeding.  Near resolution of bleeding while in ED. HDS. Will give oxybutynin, analgesia for home.  Follow-up with urology.  The patient improved significantly and was discharged in stable condition. Detailed discussions were had with the patient regarding current findings, and need for close f/u with PCP or on call doctor. The patient has been instructed to return immediately if the symptoms worsen in any way for re-evaluation. Patient verbalized understanding and is in agreement with current care plan. All questions answered prior to discharge.    Additional history obtained: -Additional history obtained from family -External records from outside source obtained and reviewed including: Chart review including previous notes, labs, imaging, consultation notes including medications, allergy list, primary care documentation, prior labs and imaging   Lab Tests: -I ordered, reviewed, and interpreted labs.   The pertinent results include:   Labs Reviewed  COMPREHENSIVE METABOLIC PANEL - Abnormal; Notable for the following components:      Result Value   Glucose, Bld 100 (*)    All other components within normal limits  URINALYSIS, W/ REFLEX TO CULTURE (INFECTION SUSPECTED) - Abnormal; Notable for the following components:   APPearance CLOUDY (*)    Hgb  urine dipstick LARGE (*)    Leukocytes,Ua MODERATE (*)    All other components within normal limits  URINE CULTURE  CBC WITH DIFFERENTIAL/PLATELET  LIPASE, BLOOD    Notable for as above  EKG   EKG Interpretation  Date/Time:    Ventricular Rate:    PR Interval:    QRS Duration:   QT Interval:    QTC Calculation:   R Axis:     Text Interpretation:           Imaging Studies ordered: I ordered imaging studies including CT abdomen I independently visualized the following imaging with scope of interpretation limited to determining acute life threatening conditions related to emergency care: stable I independently visualized and interpreted imaging. I agree with the radiologist interpretation   Medicines ordered and prescription drug management: Meds ordered this encounter  Medications   sodium chloride 0.9 % bolus 1,000 mL   DISCONTD: ondansetron (ZOFRAN) injection 4 mg   ketorolac (TORADOL) 15 MG/ML injection 15 mg   promethazine (PHENERGAN) 25 mg in sodium chloride 0.9 % 50 mL IVPB   diphenhydrAMINE (BENADRYL) injection 12.5 mg   iohexol (OMNIPAQUE) 300 MG/ML solution 100 mL   sodium chloride (PF) 0.9 % injection    Carlota Raspberry, Sherri L: cabinet override   oxybutynin (DITROPAN XL) 10 MG 24 hr tablet    Sig: Take 1 tablet (10 mg total) by mouth at bedtime for 10 days.    Dispense:  10 tablet    Refill:  0   promethazine (PHENERGAN) 25 MG tablet    Sig: Take 1 tablet (25 mg total) by mouth every 8 (eight) hours as needed for nausea or vomiting.    Dispense:  8 tablet    Refill:  0   oxyCODONE (ROXICODONE) 5 MG immediate release tablet    Sig: Take 1 tablet (5 mg total) by mouth every 6 (six) hours as  needed for severe pain.    Dispense:  10 tablet    Refill:  0    -I have reviewed the patients home medicines and have made adjustments as needed   Consultations Obtained: na   Cardiac Monitoring: The patient was maintained on a cardiac monitor.  I personally viewed  and interpreted the cardiac monitored which showed an underlying rhythm of: sr  Social Determinants of Health:  Diagnosis or treatment significantly limited by social determinants of health: obesity   Reevaluation: After the interventions noted above, I reevaluated the patient and found that they have improved  Co morbidities that complicate the patient evaluation  Past Medical History:  Diagnosis Date   Allergic rhinitis    Asthma    Colon polyps    Depression    Fibromyalgia    Gallstones    Headache, chronic daily    HLD (hyperlipidemia)    IBS (irritable bowel syndrome)    Lupus ([place])    Sleep apnea    Thyroid disorder       Dispostion: Disposition decision including need for hospitalization was considered, and patient discharged from emergency department.    Final Clinical Impression(s) / ED Diagnoses Final diagnoses:  Hematuria, unspecified type  Bladder spasm     This chart was dictated using voice recognition software.  Despite best efforts to proofread,  errors can occur which can change the documentation meaning.    Jeanell Sparrow, DO 07/03/22 [phone]

## 2022-07-04 LAB — URINE CULTURE: Culture: 20000 — AB

## 2022-07-05 ENCOUNTER — Telehealth (HOSPITAL_BASED_OUTPATIENT_CLINIC_OR_DEPARTMENT_OTHER): Payer: Self-pay

## 2022-07-05 NOTE — Telephone Encounter (Signed)
Post ED Visit - Positive Culture Follow-up  Culture report reviewed by antimicrobial stewardship pharmacist: [place] Team []  Elenor Quinones, Pharm.D. []  Heide Guile, Pharm.D., BCPS AQ-ID []  Parks Neptune, Pharm.D., BCPS [x]  Alycia Rossetti, Pharm.D., BCPS []  [place], Pharm.D., BCPS, AAHIVP []  Legrand Como, Pharm.D., BCPS, AAHIVP []  Salome Arnt, PharmD, BCPS []  Johnnette Gourd, PharmD, BCPS []  Hughes Better, PharmD, BCPS []  Leeroy Cha, PharmD []  Laqueta Linden, PharmD, BCPS []  Albertina Parr, PharmD  [place] Team []  Leodis Sias, PharmD []  Lindell Spar, PharmD []  Royetta Asal, PharmD []  Graylin Shiver, Rph []  Rema Fendt) Glennon Mac, PharmD []  Arlyn Dunning, PharmD []  Netta Cedars, PharmD []  Dia Sitter, PharmD []  Leone Haven, PharmD []  Gretta Arab, PharmD []  Theodis Shove, PharmD []  Peggyann Juba, PharmD []  Reuel Boom, PharmD   Positive urine culture Reviewed by ED provider: Lennice Sites, MD  Not treated and no treatment indicated. no further patient follow-up is required at this time.  Glennon Hamilton 07/05/2022, 11:17 AM

## 2022-07-05 NOTE — Progress Notes (Signed)
ED Antimicrobial Stewardship Positive Culture Follow Up   Ashley Mathews is an 62 y.o. female who presented to [facility] on 07/02/2022 with a chief complaint of bilateral flank pain, hematuria, nausea, dysuria x1D.   Chief Complaint  Patient presents with   Flank Pain   Hematuria    Recent Results (from the past 720 hour(s))  Urine Culture     Status: Abnormal   Collection Time: 07/02/22 10:58 AM   Specimen: Urine, Random  Result Value Ref Range Status   Specimen Description   Final    URINE, RANDOM Performed at [employer] [facility] [address] [place], [place] [address]    Special Requests   Final    NONE Reflexed from [id number] Performed at [facility], [place] [address] [place], [place] [address]    Culture 20,000 COLONIES/mL ESCHERICHIA COLI (A)  Final   Report Status 07/04/2022 FINAL  Final   Organism ID, Bacteria ESCHERICHIA COLI (A)  Final      Susceptibility   Escherichia coli - MIC*    AMPICILLIN 4 SENSITIVE Sensitive     CEFAZOLIN <=4 SENSITIVE Sensitive     CEFEPIME <=0.12 SENSITIVE Sensitive     CEFTRIAXONE <=0.25 SENSITIVE Sensitive     CIPROFLOXACIN <=0.25 SENSITIVE Sensitive     GENTAMICIN <=1 SENSITIVE Sensitive     IMIPENEM <=0.25 SENSITIVE Sensitive     NITROFURANTOIN <=16 SENSITIVE Sensitive     TRIMETH/SULFA <=20 SENSITIVE Sensitive     AMPICILLIN/SULBACTAM 4 SENSITIVE Sensitive     PIP/TAZO <=4 SENSITIVE Sensitive     * 20,000 COLONIES/mL ESCHERICHIA COLI    [x]  Patient discharged without antimicrobial agent and should continue to hold treatment  Pt with PMH of IBS, fibromyalgia, lupus presenting with 1D bilateral flank pain, hematuria, nausea, and dysuria. CT abd/pelvic unremarkable for infectious urinary process. Pt afebrile, without leukocytosis, and vitals wnl. UA with moderate LE, no nitrite, no bacteria, RBC >50, and pyuria. Ucx grew 20,000 colonies pan sensi e coli. Prior to ED discharge, pain, dysuria, and  hematuria had resolved, potentially indicative of a recently passed stone and therefore abx were not prescribed and pt was instructed to return with worsening symptoms. Given low e coli colony count and symptom resolution in ED, appropriate to continue holding abx unless pt returns with urinary symptoms.   Plan: Continue to hold treatment. Antibiotics not currently indicated  ED Provider: Lennice Sites, DO  Park Liter, PharmD Candidate 07/05/2022, 10:08 AM Clinical Pharmacist Monday - Friday phone -  [phone] Saturday - Sunday phone - [phone];

## 2022-08-07 ENCOUNTER — Telehealth: Payer: Self-pay | Admitting: Neurology

## 2022-08-07 NOTE — Telephone Encounter (Signed)
LVM and sent mychart msg informing pt of r/s needed for 4/25 appt- MD out.

## 2022-08-08 ENCOUNTER — Encounter: Payer: Self-pay | Admitting: Obstetrics and Gynecology

## 2022-08-08 ENCOUNTER — Ambulatory Visit: Payer: 59 | Admitting: Obstetrics and Gynecology

## 2022-08-08 VITALS — BP 124/79 | HR 99 | Ht 63.25 in | Wt 153.0 lb

## 2022-08-08 DIAGNOSIS — R350 Frequency of micturition: Secondary | ICD-10-CM

## 2022-08-08 DIAGNOSIS — N3941 Urge incontinence: Secondary | ICD-10-CM | POA: Diagnosis not present

## 2022-08-08 DIAGNOSIS — N393 Stress incontinence (female) (male): Secondary | ICD-10-CM

## 2022-08-08 DIAGNOSIS — R351 Nocturia: Secondary | ICD-10-CM | POA: Diagnosis not present

## 2022-08-08 DIAGNOSIS — R159 Full incontinence of feces: Secondary | ICD-10-CM

## 2022-08-08 LAB — POCT URINALYSIS DIPSTICK
Bilirubin, UA: NEGATIVE
Glucose, UA: NEGATIVE
Ketones, UA: NEGATIVE
Leukocytes, UA: NEGATIVE
Nitrite, UA: NEGATIVE
Protein, UA: NEGATIVE
Spec Grav, UA: 1.025 (ref 1.010–1.025)
Urobilinogen, UA: 1 E.U./dL
pH, UA: 5.5 (ref 5.0–8.0)

## 2022-08-08 NOTE — Patient Instructions (Addendum)
Today we talked about ways to manage bladder urgency such as altering your diet to avoid irritative beverages and foods (bladder diet) as well as attempting to decrease stress and other exacerbating factors.   Avoiding drinking 2-3 hours prior to bedtime.   The Most Bothersome Foods* The Least Bothersome Foods*  Coffee - Regular & Decaf Tea - caffeinated Carbonated beverages - cola, non-colas, diet & caffeine-free Alcohols - Beer, Red Wine, White Wine, [address] - Grapefruit, [place], Orange, [employer] - Cranberry, Grapefruit, Orange, Pineapple Vegetables - Tomato & Tomato Products Flavor Enhancers - Hot peppers, Spicy foods, Chili, Horseradish, Vinegar, Monosodium glutamate (MSG) Artificial Sweeteners - NutraSweet, Sweet 'N Low, Equal (sweetener), Saccharin Ethnic foods - [place], [place], [place] food [employer] - low-fat & whole Fruits - Bananas, Blueberries, Honey[place]w melon, Pears, Raisins, Watermelon Vegetables - Broccoli, [address] Sprouts, [place], Carrots, Cauliflower, [place], Cucumber, Mushrooms, Peas, Radishes, Squash, Zucchini, White potatoes, Sweet potatoes & yams Poultry - Chicken, Eggs, [place], [employer] - Beef, Diplomatic Services operational officer, Lamb Seafood - Shrimp, [place] fish, Salmon Grains - Oat, Rice Snacks - Pretzels, Popcorn  *Lenward Chancellor et al. Diet and its role in interstitial cystitis/bladder pain syndrome (IC/BPS) and comorbid conditions. [employer]. BJU Int. 2012 Jan 11.   Accidental Bowel Leakage: Our goal is to achieve formed bowel movements daily or every-other-day without leakage.  You may need to try different combinations of the following options to find what works best for you.  Some management options include: Dietary changes (more leafy greens, vegetables and fruits; less processed foods) Fiber supplementation (Metamucil or something with psyllium as active ingredient) Over-the-counter imodium (tablets or liquid) to help solidify the stool and prevent leakage of  stool.   Vulvovaginal moisturizer Options: Vitamin E oil (pump or capsule) or cream (Gene's Vit E Cream) Coconut oil  Consider the ingredients of the product - the fewer the ingredients the better!  Directions for Use: Clean and dry your hands Gently dab the vulvar/vaginal area dry as needed Apply a "pea-sized" amount of the moisturizer onto your fingertip Using you other hand, open the labia  Apply the moisturizer to the vulvar/vaginal tissues Wear loose fitting underwear/clothing if possible following application Use moisturize up to 3 times daily as desired.   For treatment of stress urinary incontinence, which is leakage with physical activity/movement/strainging/coughing, we discussed expectant management versus nonsurgical options versus surgery. Nonsurgical options include weight loss, physical therapy, as well as a pessary.  Surgical options include a midurethral sling, which is a synthetic mesh sling that acts like a hammock under the urethra to prevent leakage of urine, a Burch urethropexy, and transurethral injection of a bulking agent.
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
## 2022-08-09 ENCOUNTER — Ambulatory Visit: Payer: 59 | Admitting: Neurology

## 2022-08-15 ENCOUNTER — Encounter: Payer: Self-pay | Admitting: Neurology

## 2022-08-15 ENCOUNTER — Ambulatory Visit: Payer: 59 | Admitting: Neurology

## 2022-08-15 VITALS — BP 119/73 | HR 71 | Ht 64.0 in | Wt 154.6 lb

## 2022-08-15 DIAGNOSIS — Z8669 Personal history of other diseases of the nervous system and sense organs: Secondary | ICD-10-CM | POA: Diagnosis not present

## 2022-08-15 DIAGNOSIS — G4719 Other hypersomnia: Secondary | ICD-10-CM
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
## 2022-08-15 NOTE — Patient Instructions (Addendum)
Please follow-up with your current provider for sleep apnea, I am hopeful that your sleepiness may improve once you are back on track with your oral appliance.  I am sorry your appliance broke several months ago.  You have not had interim home sleep testing as I understand and have a new oral appliance on the way.  Your new provider at [facility] may be able to prescribe any medication for you for symptomatic treatment of sleepiness.  At this juncture, restarting Provigil is not justified as you report it did not really help you.  You do not qualify for medications that are approved for narcolepsy or idiopathic hypersomnolence as sleep testing in the past did not show findings in keeping with narcolepsy or idiopathic hypersomnolence.  Certain wake promoting agents are only approved also if you are on treatment for sleep apnea which you are currently waiting to restart.  Please talk to your primary care about medical evaluation for treatable causes of tiredness and sleepiness, may be consider other treatment options beside Tofranil and sertraline which both can be sedating as you know.   Please do not drive when feeling sleepy.  Please make enough time for sleep, 7 to 8 hours daily are generally recommended for the average adult.  I will see you back as needed.

## 2022-08-17 NOTE — Progress Notes (Deleted)
Verbal consent was obtained to perform simple CMG procedure:   Prolapse was reduced using 2 large cotton swabs. Urethra was prepped with betadine and a 16F catheter was placed and bladder was drained completely. The bladder was then backfilled with sterile water by gravity.  First sensation: 40 First Desire: 90 Strong Desire: 200 Capacity: 340  Detrusor overactivity noted during filling at approximately 60mls and patient felt sensations of bladder spasms during filling. She reports she frequently has to urinate multiple times in a row.   Cough stress test was negative with sitting and standing. Valsalva stress test was negative at sitting and positive with standing, she did have to jump up and down and then do valsalva and leaking was presents.  She was was allowed to void on her own.   Interpretation: CMG showed increased sensation, and within normal limits cystometric capacity. Findings positive for stress incontinence, positive for detrusor overactivity.     

## 2022-08-20 ENCOUNTER — Ambulatory Visit: Payer: No Typology Code available for payment source | Admitting: Obstetrics and Gynecology

## 2022-09-02 NOTE — Progress Notes (Unsigned)
Verbal consent was obtained to perform simple CMG procedure:   Prolapse was reduced using 2 large cotton swabs. Urethra was prepped with betadine and a 31F catheter was placed and bladder was drained completely. The bladder was then backfilled with sterile water by gravity.  First sensation: 140 First Desire: 230 Strong Desire: 310 Capacity: 420 Cough stress test was positive. Valsalva stress test was positive.  She was was allowed to void on her own.   Interpretation: CMG showed within normal limits sensation, and within normal limits cystometric capacity. Findings positive for stress incontinence, positive for detrusor overactivity.    Patient is interested in surgical options. Discussed bulking versus Sling and she reports she does not want bulking and is more interested in sling options. As she has numerous sensitivities and allergies, will have her discuss surgical plan with Dr. Florian Buff.

## 2022-09-03 ENCOUNTER — Ambulatory Visit (INDEPENDENT_AMBULATORY_CARE_PROVIDER_SITE_OTHER): Payer: 59 | Admitting: Obstetrics and Gynecology

## 2022-09-03 ENCOUNTER — Encounter: Payer: Self-pay | Admitting: Obstetrics and Gynecology

## 2022-09-03 VITALS — BP 114/74 | HR 102

## 2022-09-03 DIAGNOSIS — N393 Stress incontinence (female) (male): Secondary | ICD-10-CM | POA: Diagnosis not present

## 2022-09-03 NOTE — Patient Instructions (Signed)
Try to use the coconut oil or vitamin E cream on nights you are not using the Imvexxy.

## 2022-10-10 ENCOUNTER — Ambulatory Visit: Payer: 59 | Admitting: Neurology

## 2022-11-14 ENCOUNTER — Ambulatory Visit: Payer: 59 | Admitting: Obstetrics and Gynecology

## 2022-11-14 ENCOUNTER — Encounter: Payer: Self-pay | Admitting: Obstetrics and Gynecology

## 2022-11-14 VITALS — BP 117/70 | HR 86

## 2022-11-14 DIAGNOSIS — N3281 Overactive bladder: Secondary | ICD-10-CM | POA: Diagnosis not present

## 2022-11-14 DIAGNOSIS — N393 Stress incontinence (female) (male): Secondary | ICD-10-CM

## 2022-11-14 NOTE — Progress Notes (Signed)
Urogynecology Return Visit  SUBJECTIVE  History of Present Illness: Ashley Mathews is a 62 y.o. female seen in follow-up for incontinence. She is interested in proceeding with surgery.   SUI symptoms > urgency  CMG showed normal sensation, and normal cystometric capacity. Findings positive for stress incontinence, positive for detrusor overactivity.   Past Medical History: Patient  has a past medical history of Allergic rhinitis, Asthma, Colon polyps, Depression, Fibromyalgia, Gallstones, Headache, chronic daily, HLD (hyperlipidemia), IBS (irritable bowel syndrome), Lupus (HCC), Sleep apnea, and Thyroid disorder.   Past Surgical History: She  has a past surgical history that includes Cholecystectomy (2011); Laparoscopic total hysterectomy (1999); Cesarean section (1987); Laparoscopic endometriosis fulguration; Myringotomy; Cervical fusion (11/2015); and bladder prolapse repair.   Medications: She has a current medication list which includes the following prescription(s): nexletol, cetirizine, chlorpheniramine-hydrocodone, vitamin d3, diphenhydramine, imvexxy maintenance pack, famotidine, imipramine, klor-con 10, levothyroxine, naproxen sodium, olopatadine hcl, promethazine, and sertraline, and the following Facility-Administered Medications: gadopentetate dimeglumine.   Allergies: Patient is allergic to sulfa antibiotics, sulfonamide derivatives, amoxicillin, amoxicillin-pot clavulanate, cephalexin, ciprofloxacin, clarithromycin, doxycycline, erythromycin, indomethacin, iodine, lidocaine, penicillins, xylocaine  [lidocaine hcl], zithromax [azithromycin], and zofran [ondansetron hcl].   Social History: Patient  reports that she has never smoked. She has never used smokeless tobacco. She reports current alcohol use. She reports that she does not use drugs.      OBJECTIVE     Physical Exam: Vitals:   11/14/22 1140  BP: 117/70  Pulse: 86   Gen: No apparent distress, A&O  x 3.  Detailed Urogynecologic Evaluation:  Deferred. Prior exam showed:  POP-Q (08/08/22)   -3                                            Aa   -3                                           Ba   -7.5                                              C    2.5                                            Gh   3.5                                            Pb   7.5                                            tvl    -3  Ap   -3                                            Bp                                                  D      ASSESSMENT AND PLAN    Ashley Mathews is a 62 y.o. with:  1. SUI (stress urinary incontinence, female)   2. Overactive bladder     Plan for surgery: Exam under anesthesia, midurethral sling, cystoscopy  - We reviewed the patient's specific anatomic and functional findings, with the assistance of diagrams, and together finalized the above procedure. The planned surgical procedures were discussed along with the surgical risks outlined below, which were also provided on a detailed handout. Additional treatment options including expectant management, conservative management, medical management were discussed where appropriate.  We reviewed the benefits and risks of each treatment option.   General Surgical Risks: For all procedures, there are risks of bleeding, infection, damage to surrounding organs including but not limited to bowel, bladder, blood vessels, ureters and nerves, and need for further surgery if an injury were to occur. These risks are all low with minimally invasive surgery.   There are risks of numbness and weakness at any body site or buttock/rectal pain.  It is possible that baseline pain can be worsened by surgery, either with or without mesh. If surgery is vaginal, there is also a low risk of possible conversion to laparoscopy or open abdominal incision where indicated. Very rare risks include blood  transfusion, blood clot, heart attack, pneumonia, or death.   There is also a risk of short-term postoperative urinary retention with need to use a catheter. About half of patients need to go home from surgery with a catheter, which is then later removed in the office. The risk of long-term need for a catheter is very low. There is also a risk of worsening of overactive bladder.   Sling: The effectiveness of a midurethral vaginal mesh sling is approximately 85%, and thus, there will be times when you may leak urine after surgery, especially if your bladder is full or if you have a strong cough. There is a balance between making the sling tight enough to treat your leakage but not too tight so that you have long-term difficulty emptying your bladder. A mesh sling will not directly treat overactive bladder/urge incontinence and may worsen it.  There is an FDA safety notification on vaginal mesh procedures for prolapse but NOT mesh slings. We have extensive experience and training with mesh placement and we have close postoperative follow up to identify any potential complications from mesh. It is important to realize that this mesh is a permanent implant that cannot be easily removed. There are rare risks of mesh exposure (2-4%), pain with intercourse (0-7%), and infection (<1%). The risk of mesh exposure if more likely in a woman with risks for poor healing (prior radiation, poorly controlled diabetes, or immunocompromised). The risk of new or worsened chronic pain after mesh implant is more common in women with baseline chronic pain and/or poorly controlled anxiety or depression. Approximately 2-4% of patients will experience longer-term post-operative voiding dysfunction that  may require surgical revision of the sling. We also reviewed that postoperatively, her stream may not be as strong as before surgery.  - For preop Visit:  She is required to have a visit within 30 days of her surgery.     - Medical  clearance: not required  - Anticoagulant use: No - Medicaid Hysterectomy form: No - Accepts blood transfusion: Yes - Expected length of stay: outpatient  Request sent for surgery scheduling.   Marguerita Beards, MD

## 2022-11-19 ENCOUNTER — Encounter: Payer: Self-pay | Admitting: Obstetrics and Gynecology

## 2022-11-21 ENCOUNTER — Encounter: Payer: Self-pay | Admitting: Gastroenterology

## 2022-11-27 ENCOUNTER — Ambulatory Visit: Payer: 59 | Admitting: Sports Medicine

## 2022-11-27 VITALS — BP 124/86 | Ht 64.0 in | Wt 156.0 lb

## 2022-11-27 DIAGNOSIS — M722 Plantar fascial fibromatosis: Secondary | ICD-10-CM | POA: Diagnosis not present

## 2022-11-27 MED ORDER — MELOXICAM 15 MG PO TABS: 15.0000 mg | ORAL_TABLET | Freq: Every day | ORAL | 2 refills | Status: DC

## 2022-11-27 NOTE — Assessment & Plan Note (Signed)
Patient had had relief of plantar fascia symptoms for years using custom orthotics When the orthotics wore out she tried going without them and now has a return of right-sided plantar fasciitis  Plan New orthotics constructed today Plantar fascia exercises and stretches Arch strap  This is for the next 2 months and if she is not better we will do an ultrasound and evaluate it and see if there is other treatment needed

## 2022-11-27 NOTE — Progress Notes (Signed)
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  At the time she is moving in with her mother she was reconciling with her divorced husband when he unexpectedly died as well.  She states that this has been since 2021 and she is still resolving some of the issues.  Because of this she has not been exercising as much and the exercise helped her resolve depression.  Physical exam Pleasant female in no acute distress BP 124/86   Ht 5\' 4"  (1.626 m)   Wt 156 lb (70.8 kg)   BMI 26.78 kg/m   Right foot and left foot both have cavus arches She has bilateral first ray insufficiency with a shortened first metatarsal This gives her significant splaying between toes 1 and 2 There is some hammering and lateral breakdown particularly at toes 4 and 5 of Changes are symmetrical with her feet although the right changes are little more pronounced She has point tenderness at the medial insertion of the plantar fascia on the right She has some calluses under her metatarsal heads but these are not painful There is some loss of the transverse arch with widening of the forefoot  Walking reveals that rather than foot strike with supination she is actually pronating particularly in the forefoot  Patient was fitted for a : standard, cushioned, semi-rigid  orthotic. The orthotic was heated and afterward the patient stood on the orthotic blank positioned on the orthotic stand. The patient was positioned in subtalar neutral position and 10 degrees of ankle dorsiflexion in a weight bearing stance. After completion of molding, a stable base was applied to the orthotic blank. The blank was ground to a stable position for weight bearing. Size: 8 blue EVA Base: incorporated into blank Posting: none Additional orthotic padding: none  Completion of orthotic she had a neutral gait and had good cushion We will modify this as needed

## 2023-01-14 ENCOUNTER — Telehealth: Payer: Self-pay

## 2023-01-14 NOTE — Telephone Encounter (Signed)
Prolia was recommended for pt 02/2022 but therapy was not started d/t cost.   Pt should be eligible for [employer] co-pay assistance.   Will reach out to pt to see if still interested in treatment with Prolia.

## 2023-01-16 NOTE — Telephone Encounter (Signed)
Called pt at [phone] and left VM to call the office.

## 2023-01-22 ENCOUNTER — Ambulatory Visit: Payer: 59 | Admitting: Family Medicine

## 2023-02-07 ENCOUNTER — Encounter: Payer: Self-pay | Admitting: Gastroenterology

## 2023-02-07 ENCOUNTER — Ambulatory Visit (INDEPENDENT_AMBULATORY_CARE_PROVIDER_SITE_OTHER): Payer: 59 | Admitting: Gastroenterology

## 2023-02-07 VITALS — BP 130/80 | HR 76 | Ht 64.0 in | Wt 160.0 lb

## 2023-02-07 DIAGNOSIS — Z860101 Personal history of adenomatous and serrated colon polyps: Secondary | ICD-10-CM | POA: Diagnosis not present

## 2023-02-07 DIAGNOSIS — R197 Diarrhea, unspecified: Secondary | ICD-10-CM | POA: Diagnosis not present

## 2023-02-07 DIAGNOSIS — K580 Irritable bowel syndrome with diarrhea: Secondary | ICD-10-CM | POA: Insufficient documentation

## 2023-02-07 DIAGNOSIS — Z860100 Personal history of colon polyps, unspecified: Secondary | ICD-10-CM | POA: Insufficient documentation

## 2023-02-07 MED ORDER — NA SULFATE-K SULFATE-MG SULF 17.5-3.13-1.6 GM/177ML PO SOLN: 1.0000 | Freq: Once | ORAL | 0 refills | Status: AC

## 2023-02-07 MED ORDER — DICYCLOMINE HCL 10 MG PO CAPS: 10.0000 mg | ORAL_CAPSULE | Freq: Every day | ORAL | 11 refills | Status: AC

## 2023-02-07 NOTE — Progress Notes (Signed)
Agree with assessment and plan as outlined.  

## 2023-02-07 NOTE — Patient Instructions (Signed)
We have sent the following medications to your pharmacy for you to pick up at your convenience: Dicyclomine 10 mg daily for abdominal spasm.  You have been scheduled for a colonoscopy. Please follow written instructions given to you at your visit today.   Please pick up your prep supplies at the pharmacy within the next 1-3 days.  If you use inhalers (even only as needed), please bring them with you on the day of your procedure.  DO NOT TAKE 7 DAYS PRIOR TO TEST- Trulicity (dulaglutide) Ozempic, Wegovy (semaglutide) Mounjaro (tirzepatide) Bydureon Bcise (exanatide extended release)  DO NOT TAKE 1 DAY PRIOR TO YOUR TEST Rybelsus (semaglutide) Adlyxin (lixisenatide) Victoza (liraglutide) Byetta (exanatide) ________________________________________________________________  _______________________________________________________  If your blood pressure at your visit was 140/90 or greater, please contact your primary care physician to follow up on this.  _______________________________________________________  If you are age 75 or older, your body mass index should be between 23-30. Your Body mass index is 27.46 kg/m. If this is out of the aforementioned range listed, please consider follow up with your Primary Care Provider.  If you are age 47 or younger, your body mass index should be between 19-25. Your Body mass index is 27.46 kg/m. If this is out of the aformentioned range listed, please consider follow up with your Primary Care Provider.   ________________________________________________________  The [facility] providers would like to encourage you to use [facility] to communicate with providers for non-urgent requests or questions.  Due to long hold times on the telephone, sending your provider a message by [facility] may be a faster and more efficient way to get a response.  Please allow 48 business hours for a response.  Please remember that this is for non-urgent requests.   _______________________________________________________

## 2023-02-07 NOTE — Progress Notes (Signed)
02/07/2023 Ashley Mathews [id number] Nov 29, 1960   HISTORY OF PRESENT ILLNESS: This is a 62 year old female who is a patient Dr. Lanetta Inch.  She met him once back in March or 2020.  She is here today with complaints of diarrhea and abdominal pain, which she thinks is related to her IBS.  She does not have a gallbladder.  Had taken Questran for loose stools in the past.  Eventually she was taking care of her sick mother and sick husband and then ran out of her prescription.  Just dealt with the diarrhea, etc. for a while.  Now she is trying to get back on top of her health issues.  She says that she has been back on the Questran now since about the springtime.  She says if she takes it daily her symptoms are much less, but she is not symptom or pain-free.  She says if she does not take it and then obviously to a lesser degree with taking a daily then she gets sick.  She says if she does not take it then she is miserable when she has to have a bowel movement.  She gets a lot of cramping to the point that she gets nauseous and flushed.  She says that lasts about 30 to 60 minutes.  It eventually dissipates, but then she just feels wiped out.  With taking Questran symptoms are much less and much more manageable/tolerable.  She denies rectal bleeding.  Colonoscopy 05/11/2009 - residual stool in the colon, no pathology otherwise Colonoscopy 12/22/2014 - 7mm descending adenoma, small rectal hyperplastic polyps  Referred back here on this occasion by her PCP, Dr. Renne Crigler, for IBS with diarrhea.  Past Medical History:  Diagnosis Date   Allergic rhinitis    Asthma    Chronic headache    Colon polyps    Depression    Fibromyalgia    Fibromyalgia    Gallstones    Headache, chronic daily    HLD (hyperlipidemia)    IBS (irritable bowel syndrome)    Lupus    Sleep apnea    Thyroid disorder    Past Surgical History:  Procedure Laterality Date   bladder prolapse repair     CERVICAL FUSION  11/2015    CESAREAN SECTION  1987   CHOLECYSTECTOMY  2011   LAPAROSCOPIC ENDOMETRIOSIS FULGURATION     LAPAROSCOPIC TOTAL HYSTERECTOMY  1999   MYRINGOTOMY      reports that she has never smoked. She has never used smokeless tobacco. She reports current alcohol use. She reports that she does not use drugs. family history includes Allergic rhinitis in her mother; Colon polyps in her mother; Diabetes in her maternal uncle and mother; Diverticulitis in her mother; Heart attack in her maternal grandfather; Heart disease in her maternal aunt, maternal uncle, maternal uncle, maternal uncle, and mother; Heart murmur in her brother; Hypertension in her mother; Irritable bowel syndrome in her mother; Lung cancer in her maternal aunt and maternal uncle; Scleroderma in her mother; Sleep apnea in her mother; Stroke in her mother. Allergies  Allergen Reactions   Sulfa Antibiotics Anaphylaxis   Sulfonamide Derivatives Anaphylaxis    Delayed anaphylaxis    Amoxicillin    Amoxicillin-Pot Clavulanate Hives   Cephalexin Hives   Ciprofloxacin    Clarithromycin Hives    Serum sickness syndrome   Doxycycline     REACTION: unknown   Erythromycin Hives and Other (See Comments)    Headaches   Indomethacin  Iodine Other (See Comments)    Nausea and migraine   Lidocaine     Headaches and nausea   Penicillins     Serum sickness syndrome   Xylocaine  [Lidocaine Hcl] Other (See Comments)    Headaches and HIves   Zithromax [Azithromycin] Hives    Serum sickness syndrome   Zofran [Ondansetron Hcl] Palpitations      Outpatient Encounter Medications as of 02/07/2023  Medication Sig   Bempedoic Acid (NEXLETOL) 180 MG TABS Take by mouth.   cetirizine (ZYRTEC) 10 MG tablet Take 10 mg by mouth daily.   chlorpheniramine-HYDROcodone (TUSSIONEX) 10-8 MG/5ML SUER    Cholecalciferol (VITAMIN D3) 5000 units CAPS Take 1 capsule by mouth daily.   diphenhydrAMINE (BENADRYL) 25 MG tablet Take 25 mg by mouth as needed.    Estradiol (IMVEXXY MAINTENANCE PACK) 4 MCG INST Place vaginally.   famotidine (PEPCID) 20 MG tablet Take 20 mg by mouth 2 (two) times daily. OTC   imipramine (TOFRANIL) 25 MG tablet Take 25 mg by mouth at bedtime.    KLOR-CON 10 10 MEQ tablet    levothyroxine (SYNTHROID, LEVOTHROID) 100 MCG tablet Take 88 mcg by mouth daily. Monday-Friday   meloxicam (MOBIC) 15 MG tablet Take 1 tablet (15 mg total) by mouth daily.   NAPROXEN SODIUM PO Take 220 mg by mouth as needed.   Olopatadine HCl 0.2 % SOLN as needed.   promethazine (PHENERGAN) 25 MG tablet Take 1 tablet (25 mg total) by mouth every 8 (eight) hours as needed for nausea or vomiting.   sertraline (ZOLOFT) 100 MG tablet Take 100 mg by mouth daily.   Facility-Administered Encounter Medications as of 02/07/2023  Medication   gadopentetate dimeglumine (MAGNEVIST) injection 15 mL     REVIEW OF SYSTEMS  : All other systems reviewed and negative except where noted in the History of Present Illness.   PHYSICAL EXAM: BP 130/80   Pulse 76   Ht 5\' 4"  (1.626 m)   Wt 160 lb (72.6 kg)   BMI 27.46 kg/m  General: Well developed white female in no acute distress Head: Normocephalic and atraumatic Eyes:  Sclerae anicteric, conjunctiva pink. Ears: Normal auditory acuity Lungs: Clear throughout to auscultation; no W/R/R. Heart: Regular rate and rhythm; no MR/G. Abdomen: Soft, non-distended.  BS present.  Mild left sided TTP. Rectal:  Will be done at the time of colonoscopy. Musculoskeletal: Symmetrical with no gross deformities  Skin: No lesions on visible extremities Extremities: No edema  Neurological: Alert oriented x 4, grossly non-focal Psychological:  Alert and cooperative. Normal mood and affect  ASSESSMENT AND PLAN: *Diarrhea/loose stools with abdominal pain: Symptoms improved if she uses Questran daily, but still not completely resolved.  Will evaluate with colonoscopy since she needs that for colon polyp surveillance anyways.  This  was thought to be due to bile salt related diarrhea from not having a gallbladder.  She will continue the Questran.  Will also try Bentyl starting with 10 mg once daily and see if that helps.  We certainly can titrate that up/increase if needed.  Prescription sent to pharmacy. *Personal history of colon polyps: Had a 7 mm adenoma removed in September 2016 with a repeat recommended in 7 years.  Will schedule Dr. Adela Lank.  **The risks, benefits, and alternatives to colonoscopy were discussed with the patient and she consents to proceed.   CC:  Merri Brunette, MD

## 2023-02-18 ENCOUNTER — Ambulatory Visit (INDEPENDENT_AMBULATORY_CARE_PROVIDER_SITE_OTHER): Payer: 59 | Admitting: Family Medicine

## 2023-02-18 ENCOUNTER — Encounter: Payer: Self-pay | Admitting: Family Medicine

## 2023-02-18 ENCOUNTER — Other Ambulatory Visit: Payer: Self-pay

## 2023-02-18 VITALS — BP 132/82 | Ht 64.0 in | Wt 159.0 lb

## 2023-02-18 DIAGNOSIS — M7731 Calcaneal spur, right foot: Secondary | ICD-10-CM | POA: Insufficient documentation

## 2023-02-18 DIAGNOSIS — M722 Plantar fascial fibromatosis: Secondary | ICD-10-CM

## 2023-02-18 DIAGNOSIS — G5731 Lesion of lateral popliteal nerve, right lower limb: Secondary | ICD-10-CM

## 2023-02-18 DIAGNOSIS — M25571 Pain in right ankle and joints of right foot: Secondary | ICD-10-CM

## 2023-02-18 NOTE — Assessment & Plan Note (Signed)
Right plantar calcaneal heel spur as noted on [facility] ultrasound today.  Likely contributing to her pain  Plan: -Continue to wear custom orthotics with added heel cushioning -Follow-up for ultrasound-guided plantar fascia injection as noted above

## 2023-02-18 NOTE — Progress Notes (Signed)
DATE OF VISIT: 02/18/2023        Ashley Mathews DOB: [birth year] MRN: [id number]  CC:  f/u RT foot/heel pain  History of present Illness: Ashley Mathews is a 62 y.o. female who presents for a follow-up visit for foot/heel pain. Patient of Dr Darrick Penna - last seen 11/27/22 - had worsening Rt heel pain at that time thought to be related to plantar fasciitis  - was fitted with new orthotics, as prior pair from 2018 was worn out - also given PF HEP and fitted with arch straps  Today she reports ongoing pain in the Rt heel Orthotics have not been helpful No recent ultrasound images Painful when she is driving - pain with heel resting on floorboard of the car - pain radiating along lateral foot - no swelling or bruising - no numbness/tingling Previous cortisone u/s guided PF injection several years ago that was helpful - last was ~10 years ago  Still having issues with LT 5th toe she broke about 1 year ago - occ pain and swelling - she wonders if altering her gait is affecting her right heel   Medications:  Outpatient Encounter Medications as of 02/18/2023  Medication Sig   Bempedoic Acid (NEXLETOL) 180 MG TABS Take by mouth.   cetirizine (ZYRTEC) 10 MG tablet Take 10 mg by mouth daily.   chlorpheniramine-HYDROcodone (TUSSIONEX) 10-8 MG/5ML SUER    Cholecalciferol (VITAMIN D3) 5000 units CAPS Take 1 capsule by mouth daily.   dicyclomine (BENTYL) 10 MG capsule Take 1 capsule (10 mg total) by mouth daily.   diphenhydrAMINE (BENADRYL) 25 MG tablet Take 25 mg by mouth as needed.   Estradiol (IMVEXXY MAINTENANCE PACK) 4 MCG INST Place vaginally.   famotidine (PEPCID) 20 MG tablet Take 20 mg by mouth 2 (two) times daily. OTC   imipramine (TOFRANIL) 25 MG tablet Take 25 mg by mouth at bedtime.    KLOR-CON 10 10 MEQ tablet    levothyroxine (SYNTHROID, LEVOTHROID) 100 MCG tablet Take 88 mcg by mouth daily. Monday-Friday   meloxicam (MOBIC) 15 MG tablet Take 1 tablet (15 mg total) by mouth  daily.   NAPROXEN SODIUM PO Take 220 mg by mouth as needed.   Olopatadine HCl 0.2 % SOLN as needed.   promethazine (PHENERGAN) 25 MG tablet Take 1 tablet (25 mg total) by mouth every 8 (eight) hours as needed for nausea or vomiting.   sertraline (ZOLOFT) 100 MG tablet Take 100 mg by mouth daily.   Facility-Administered Encounter Medications as of 02/18/2023  Medication   gadopentetate dimeglumine (MAGNEVIST) injection 15 mL    Allergies: is allergic to sulfa antibiotics, sulfonamide derivatives, amoxicillin, amoxicillin-pot clavulanate, cephalexin, ciprofloxacin, clarithromycin, doxycycline, erythromycin, indomethacin, iodine, lidocaine, penicillins, xylocaine  [lidocaine hcl], zithromax [azithromycin], and zofran [ondansetron hcl].  Physical Examination: Vitals: BP 132/82   Ht 5\' 4"  (1.626 m)   Wt 159 lb (72.1 kg)   BMI 27.29 kg/m  GENERAL:  Ashley Mathews is a 62 y.o. female appearing their stated age, alert and oriented x 3, in no apparent distress.  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  Ankle strength 5/5 throughout bilaterally Walking with an antalgic gait, she is walking with supinated gait on the right and putting pressure more on the forefoot to unload the right heel Orthotics are in good condition.  These do place her in a neutral position  NEURO: sensation intact to light touch lower extremity bilaterally VASC: pulses 2+ and symmetric DP/PT bilaterally, no edema  Radiology: LIMITED MSK U/S RT  FOOT INDICATIONS: Rt foot/heel pain r/o PF abnormality or other abnormality FINDINGS: -Small calcaneal heel spur noted along plantar aspect of the foot. -Plantar fascia with thickening measuring 0.6 cm.  Does have hypoechoic change, and small amount of hypoechoic fluid near the insertion.  No increased Doppler flow or needle vessels noted. -Normal-appearing peroneal tendons along the lateral aspect of the foot/ankle.  Normal insertion of peroneus brevis on base of the fifth metatarsal - did have some irritation with sono-palpation over sural nerve at and just below lateral malleolus.  No visible abnormalities of sural nerve noted  IMPRESSION: -Right sided plantar fasciitis with associated calcaneal heel spur - Irritation of sural nerve with sono-palpation, but not visible abnormalities of sural nerve  Assessment & Plan Plantar fasciitis of right foot Acute on chronic right foot pain which has not been improving with new custom orthotics as expected.  MSK ultrasound findings today showing thickened plantar fascia with hypoechoic change and small calcaneal heel spur -Did quite well with previous plantar fascia injection in 2014  Plan: -Previous notes from 2014 reviewed, including ultrasound findings -Limited MSK ultrasound of the right foot completed today with findings as noted above consistent with plantar fasciitis and heel spur -Treatment options discussed including ongoing watchful waiting with her custom orthotics versus plantar fascia cortisone injection versus shockwave therapy --She would like to proceed with ultrasound-guided plantar fascia injection.  Risks and benefits discussed.  Advised should plan to take a few days to rest after the injection.  She will take a look at her schedule and plan a follow-up appointment in the near future for this.  Of note she has a lidocaine allergy and has had issues with vagal responses from even small amounts of lidocaine at the dentist.  She is able  to tolerate Marcaine.  Will be sure to use Marcaine along with steroid for the injection -Continue use custom orthotics in the interim -Ice as needed -Follow-up for plantar fascia injection as noted above.  If no improvement with injection could consider trial of shockwave therapy.  Potential out-of-pocket cost of shockwave reviewed today Heel spur, right Right plantar calcaneal heel spur as noted on MSK ultrasound today.  Likely contributing to her pain  Plan: -Continue to wear custom orthotics with added heel cushioning -Follow-up for ultrasound-guided plantar fascia injection as noted above Entrapment neuropathy of right sural nerve Having heel pain consistent with plantar fasciitis and her heel spur, but also having some lateral ankle pain which may be consistent with possible sural nerve entrapment.  Likely the result of altered gait from walking with a more supinated gait to unload the heel  Plan: -Limited MSK ultrasound of the right foot and ankle did not show any significant sural nerve abnormalities today -Plan for trial of ultrasound-guided plantar fascia injection to see if this improves her symptoms -Continue to wear custom orthotics as noted above   Patient expressed understanding & agreement with above.  Encounter Diagnoses  Name Primary?   Plantar fasciitis of right foot Yes   Heel spur, right  Entrapment neuropathy of right sural nerve     Orders Placed This Encounter  Procedures   [place] LIMITED JOINT SPACE STRUCTURES LOW RIGHT

## 2023-02-18 NOTE — Assessment & Plan Note (Signed)
Acute on chronic right foot pain which has not been improving with new custom orthotics as expected.  MSK ultrasound findings today showing thickened plantar fascia with hypoechoic change and small calcaneal heel spur -Did quite well with previous plantar fascia injection in 2014  Plan: -Previous notes from 2014 reviewed, including ultrasound findings -Limited MSK ultrasound of the right foot completed today with findings as noted above consistent with plantar fasciitis and heel spur -Treatment options discussed including ongoing watchful waiting with her custom orthotics versus plantar fascia cortisone injection versus shockwave therapy --She would like to proceed with ultrasound-guided plantar fascia injection.  Risks and benefits discussed.  Advised should plan to take a few days to rest after the injection.  She will take a look at her schedule and plan a follow-up appointment in the near future for this.  Of note she has a lidocaine allergy and has had issues with vagal responses from even small amounts of lidocaine at the dentist.  She is able to tolerate Marcaine.  Will be sure to use Marcaine along with steroid for the injection -Continue use custom orthotics in the interim -Ice as needed -Follow-up for plantar fascia injection as noted above.  If no improvement with injection could consider trial of shockwave therapy.  Potential out-of-pocket cost of shockwave reviewed today

## 2023-03-11 ENCOUNTER — Other Ambulatory Visit: Payer: Self-pay

## 2023-03-11 ENCOUNTER — Ambulatory Visit (INDEPENDENT_AMBULATORY_CARE_PROVIDER_SITE_OTHER): Payer: 59 | Admitting: Family Medicine

## 2023-03-11 ENCOUNTER — Telehealth: Payer: Self-pay | Admitting: Gastroenterology

## 2023-03-11 ENCOUNTER — Encounter: Payer: Self-pay | Admitting: Family Medicine

## 2023-03-11 VITALS — BP 136/82 | Ht 64.0 in | Wt 157.0 lb

## 2023-03-11 DIAGNOSIS — M722 Plantar fascial fibromatosis: Secondary | ICD-10-CM

## 2023-03-11 MED ORDER — NA SULFATE-K SULFATE-MG SULF 17.5-3.13-1.6 GM/177ML PO SOLN: 1.0000 | Freq: Once | ORAL | 0 refills | Status: AC

## 2023-03-11 MED ORDER — METHYLPREDNISOLONE ACETATE 40 MG/ML IJ SUSP
40.0000 mg | Freq: Once | INTRAMUSCULAR | Status: AC
  Administered 2023-03-11: 40 mg via INTRA_ARTICULAR

## 2023-03-11 NOTE — Assessment & Plan Note (Signed)
Acute on chronic right foot pain which has not been improving with new custom orthotics as expected.  MSK ultrasound findings last visit 02/18/23 showing thickened plantar fascia with hypoechoic change and small calcaneal heel spur -Did quite well with previous plantar fascia injection in 2014  PLAN: - proceed with u/s guided Rt PF injection today.  See Procedure Note below  PROCEDURE:  Risks & benefits of u/s guided RT plantar fascia injection reviewed. Consent obtained. Time-out completed. Patient prepped and draped in the normal fashion. Musculoskeletal ultrasound used to identify appropriate anatomy. RT PF with thickening and hypoechoic change.  No increased doppler flow or neo-vessels.  No other abnormalities.    After identifying appropriate anatomy, patient positioned & area cleansed with chlorhexadine. Ethyl chloride spray used to anesthetize the skin. Solution of 3 mL 0.5% Bupivacaine with 1 mL methylprednisolone (Depo-medrol) 40mg /mL injected into the Rt foot  using a 25-gauge 1.5-inch needle via medial approach under ultrasound guidance. Needle well-visualized deep to the plantar fascia. Patient tolerated procedure well without any complications. Area covered with adhesive bandage.  Post-injection care reviewed, all questions answered.  Images saved.  - can take Tylenol prn for pain - should not take NSAIDs due to upcoming colonoscopy - heat or ice prn - f/u 4-6 weeks to reassess, sooner prn.  If still having ongoing lateral symptoms, could consider further evaluation of sural nerve

## 2023-03-11 NOTE — Telephone Encounter (Signed)
Script to pharmacy

## 2023-03-11 NOTE — Patient Instructions (Signed)
Today you received an injection with a corticosteroid (aka: cortisone injection). This injection is usually done in response to pain and inflammation. There is some "numbing medicine" (Lidocaine) in the shot, so the injected area may be numb and feel really good for the next couple of hours. The numbing medicine usually wears off in 2-3 hours, and then your pain level may be back to where it was before the injection until the cortisone starts working.    The actually benefit from the steroid injection is usually noticed within 3-5 days, but may take up to 14 days. You may actually experience a small (as in 10%) INCREASE in pain in the first 24 hours---that is common.  Things to watch out for that you should contact us or a health care provider urgently would include: 1. Unusual (as in more than 10%) increase in pain 2. New fever > 101.5 3. New swelling or redness of the injected area. 4. Streaking of red lines around the area injected.  You can take Tylenol Extra-strength 500mg  1-2 tabs every 6-hours as needed, max 8-tabs/day, no NSAIDs due to upcoming colonoscopy later this week.  You should follow-up with me in 4-6 weeks to reassess.  Do not hesitate to call or reach out with any questions or concerns.

## 2023-03-11 NOTE — Progress Notes (Signed)
DATE OF VISIT: 03/11/2023        Ashley Mathews DOB: [birth year] MRN: [id number]  CC:  Rt plantar fascia injection  History of present Illness: Ashley Mathews is a 62 y.o. female who presents for a follow-up visit for Rt plantar fascia injection. Continues to have ongoing pain No improvement from last visit 02/18/23 Would like to proceed with injection today  Has Lidocaine allergy - need to use Marcaine for anesthetic   Medications:  Outpatient Encounter Medications as of 03/11/2023  Medication Sig   Bempedoic Acid (NEXLETOL) 180 MG TABS Take by mouth.   cetirizine (ZYRTEC) 10 MG tablet Take 10 mg by mouth daily.   chlorpheniramine-HYDROcodone (TUSSIONEX) 10-8 MG/5ML SUER    Cholecalciferol (VITAMIN D3) 5000 units CAPS Take 1 capsule by mouth daily.   dicyclomine (BENTYL) 10 MG capsule Take 1 capsule (10 mg total) by mouth daily.   diphenhydrAMINE (BENADRYL) 25 MG tablet Take 25 mg by mouth as needed.   Estradiol (IMVEXXY MAINTENANCE PACK) 4 MCG INST Place vaginally.   famotidine (PEPCID) 20 MG tablet Take 20 mg by mouth 2 (two) times daily. OTC   imipramine (TOFRANIL) 25 MG tablet Take 25 mg by mouth at bedtime.    KLOR-CON 10 10 MEQ tablet    levothyroxine (SYNTHROID, LEVOTHROID) 100 MCG tablet Take 88 mcg by mouth daily. Monday-Friday   meloxicam (MOBIC) 15 MG tablet Take 1 tablet (15 mg total) by mouth daily.   NAPROXEN SODIUM PO Take 220 mg by mouth as needed.   Olopatadine HCl 0.2 % SOLN as needed.   promethazine (PHENERGAN) 25 MG tablet Take 1 tablet (25 mg total) by mouth every 8 (eight) hours as needed for nausea or vomiting.   sertraline (ZOLOFT) 100 MG tablet Take 100 mg by mouth daily.   Facility-Administered Encounter Medications as of 03/11/2023  Medication   gadopentetate dimeglumine (MAGNEVIST) injection 15 mL    Allergies: is allergic to sulfa antibiotics, sulfonamide derivatives, amoxicillin, amoxicillin-pot clavulanate, cephalexin, ciprofloxacin,  clarithromycin, doxycycline, erythromycin, indomethacin, iodine, lidocaine, penicillins, xylocaine  [lidocaine hcl], zithromax [azithromycin], and zofran [ondansetron hcl].  Physical Examination: Vitals: BP 136/82   Ht 5\' 4"  (1.626 m)   Wt 157 lb (71.2 kg)   BMI 26.95 kg/m  GENERAL:  Ashley Mathews is a 62 y.o. female appearing their stated age, alert and oriented x 3, in no apparent distress.  SKIN: no rashes or lesions, skin clean, dry, intact MSK: RT foot with TTP along insertion of PF along medial calcaneous.  No increased redness or warmth Walking with antalgic gait N/V/I distally Assessment & Plan Plantar fasciitis of right foot Acute on chronic right foot pain which has not been improving with new custom orthotics as expected.  MSK ultrasound findings last visit 02/18/23 showing thickened plantar fascia with hypoechoic change and small calcaneal heel spur -Did quite well with previous plantar fascia injection in 2014  PLAN: - proceed with u/s guided Rt PF injection today.  See Procedure Note below  PROCEDURE:  Risks & benefits of u/s guided RT plantar fascia injection reviewed. Consent obtained. Time-out completed. Patient prepped and draped in the normal fashion. Musculoskeletal ultrasound used to identify appropriate anatomy. RT PF with thickening and hypoechoic change.  No increased doppler flow or neo-vessels.  No other abnormalities.    After identifying appropriate anatomy, patient positioned & area cleansed with chlorhexadine. Ethyl chloride spray used to anesthetize the skin. Solution of 3 mL 0.5% Bupivacaine with 1 mL methylprednisolone (Depo-medrol) 40mg /mL injected into  the Rt foot  using a 25-gauge 1.5-inch needle via medial approach under ultrasound guidance. Needle well-visualized deep to the plantar fascia. Patient tolerated procedure well without any complications. Area covered with adhesive bandage.  Post-injection care reviewed, all questions answered.  Images  saved.  - can take Tylenol prn for pain - should not take NSAIDs due to upcoming colonoscopy - heat or ice prn - f/u 4-6 weeks to reassess, sooner prn.  If still having ongoing lateral symptoms, could consider further evaluation of sural nerve   Patient expressed understanding & agreement with above.  Encounter Diagnosis  Name Primary?   Plantar fasciitis of right foot Yes    Orders Placed This Encounter  Procedures   [place] LIMITED JOINT SPACE STRUCTURES LOW RIGHT

## 2023-03-11 NOTE — Telephone Encounter (Signed)
Inbound call from patient, would like Suprep called [facility] on [address]. States she was never advised it was called in. Her procedure is on 11/27 at 11:00 AM.

## 2023-03-13 ENCOUNTER — Encounter: Payer: Self-pay | Admitting: Gastroenterology

## 2023-03-13 ENCOUNTER — Ambulatory Visit: Payer: 59 | Admitting: Gastroenterology

## 2023-03-13 VITALS — BP 139/77 | HR 69 | Temp 97.9°F | Resp 12 | Ht 64.0 in | Wt 160.0 lb

## 2023-03-13 DIAGNOSIS — K648 Other hemorrhoids: Secondary | ICD-10-CM | POA: Diagnosis not present

## 2023-03-13 DIAGNOSIS — Z1211 Encounter for screening for malignant neoplasm of colon: Secondary | ICD-10-CM

## 2023-03-13 DIAGNOSIS — D123 Benign neoplasm of transverse colon: Secondary | ICD-10-CM

## 2023-03-13 DIAGNOSIS — Z860100 Personal history of colon polyps, unspecified: Secondary | ICD-10-CM

## 2023-03-13 DIAGNOSIS — K5730 Diverticulosis of large intestine without perforation or abscess without bleeding: Secondary | ICD-10-CM | POA: Diagnosis not present

## 2023-03-13 DIAGNOSIS — D122 Benign neoplasm of ascending colon: Secondary | ICD-10-CM

## 2023-03-13 DIAGNOSIS — D175 Benign lipomatous neoplasm of intra-abdominal organs: Secondary | ICD-10-CM | POA: Diagnosis not present

## 2023-03-13 DIAGNOSIS — Q439 Congenital malformation of intestine, unspecified: Secondary | ICD-10-CM | POA: Diagnosis not present

## 2023-03-13 DIAGNOSIS — Z860101 Personal history of adenomatous and serrated colon polyps: Secondary | ICD-10-CM | POA: Diagnosis not present

## 2023-03-13 DIAGNOSIS — K635 Polyp of colon: Secondary | ICD-10-CM | POA: Diagnosis not present

## 2023-03-13 HISTORY — PX: COLONOSCOPY: SHX174

## 2023-03-13 MED ORDER — SODIUM CHLORIDE 0.9 % IV SOLN: 500.0000 mL | Freq: Once | INTRAVENOUS | Status: DC

## 2023-03-13 NOTE — Progress Notes (Signed)
In PACU pt reports pain 8/10. Levsin 2 tablets given. Passing gas. Pt crying. Dr. Adela Lank informed. Per Dr. Adela Lank to given Zofran. Per Dr. Adela Lank not to give due to pt allergy to Zofran. Pt turned on her right side, air passed. Turned patient on left side and passed air. Unable to get on hand's and knee's.Patient passing air during this time. Patient was assisted up and walking. Patient was able to tolerate walking. Reports pain is 4/10 and is tolerable.Pt discharged at 12:39 in wheelchair.

## 2023-03-13 NOTE — Patient Instructions (Signed)
Educational handout provided to patient related to Hemorrhoids, Polyps, and Diverticulosis  Resume previous diet  Continue present medications  Awaiting pathology results   YOU HAD AN ENDOSCOPIC PROCEDURE TODAY AT THE [facility]:   Refer to the procedure report that was given to you for any specific questions about what was found during the examination.  If the procedure report does not answer your questions, please call your gastroenterologist to clarify.  If you requested that your care partner not be given the details of your procedure findings, then the procedure report has been included in a sealed envelope for you to review at your convenience later.  YOU SHOULD EXPECT: Some feelings of bloating in the abdomen. Passage of more gas than usual.  Walking can help get rid of the air that was put into your GI tract during the procedure and reduce the bloating. If you had a lower endoscopy (such as a colonoscopy or flexible sigmoidoscopy) you may notice spotting of blood in your stool or on the toilet paper. If you underwent a bowel prep for your procedure, you may not have a normal bowel movement for a few days.  Please Note:  You might notice some irritation and congestion in your nose or some drainage.  This is from the oxygen used during your procedure.  There is no need for concern and it should clear up in a day or so.  SYMPTOMS TO REPORT IMMEDIATELY:  Following lower endoscopy (colonoscopy or flexible sigmoidoscopy):  Excessive amounts of blood in the stool  Significant tenderness or worsening of abdominal pains  Swelling of the abdomen that is new, acute  Fever of 100F or higher   For urgent or emergent issues, a gastroenterologist can be reached at any hour by calling [phone]. Do not use MyChart messaging for urgent concerns.    DIET:  We do recommend a small meal at first, but then you may proceed to your regular diet.  Drink plenty of fluids but you should  avoid alcoholic beverages for 24 hours.  ACTIVITY:  You should plan to take it easy for the rest of today and you should NOT DRIVE or use heavy machinery until tomorrow (because of the sedation medicines used during the test).    FOLLOW UP: Our staff will call the number listed on your records the next business day following your procedure.  We will call around 7:15- 8:00 am to check on you and address any questions or concerns that you may have regarding the information given to you following your procedure. If we do not reach you, we will leave a message.     If any biopsies were taken you will be contacted by phone or by letter within the next 1-3 weeks.  Please call us at [phone] if you have not heard about the biopsies in 3 weeks.    SIGNATURES/CONFIDENTIALITY: You and/or your care partner have signed paperwork which will be entered into your electronic medical record.  These signatures attest to the fact that that the information above on your After Visit Summary has been reviewed and is understood.  Full responsibility of the confidentiality of this discharge information lies with you and/or your care-partner.

## 2023-03-13 NOTE — Op Note (Signed)
[facility] Patient Name: Ashley Mathews Procedure Date: 03/13/2023 10:47 AM MRN: [id number] Endoscopist: Viviann Spare P. Adela Lank , MD, [id number] Age: 62 Referring MD:  Date of Birth: 1961-04-02 Gender: Female Account #: [id number] Procedure:                Colonoscopy Indications:              High risk colon cancer surveillance: Personal                            history of colonic polyps - adenoma removed 12/2014                            (Dr. Loreta Ave), incidental - chronic loose stools                            controlled with cholestyramine, history of                            cholecystectomy) Medicines:                Monitored Anesthesia Care Procedure:                Pre-Anesthesia Assessment:                           - Prior to the procedure, a History and Physical                            was performed, and patient medications and                            allergies were reviewed. The patient's tolerance of                            previous anesthesia was also reviewed. The risks                            and benefits of the procedure and the sedation                            options and risks were discussed with the patient.                            All questions were answered, and informed consent                            was obtained. Prior Anticoagulants: The patient has                            taken no anticoagulant or antiplatelet agents. ASA                            Grade Assessment: III - A patient with severe  systemic disease. After reviewing the risks and                            benefits, the patient was deemed in satisfactory                            condition to undergo the procedure.                           After obtaining informed consent, the colonoscope                            was passed under direct vision. Throughout the                            procedure, the patient's blood pressure, pulse,  and                            oxygen saturations were monitored continuously. The                            Olympus Scope SN: [id number] was introduced through                            the anus and advanced to the the cecum, identified                            by appendiceal orifice and ileocecal valve. The                            colonoscopy was performed without difficulty. The                            patient tolerated the procedure well. The quality                            of the bowel preparation was adequate. The                            ileocecal valve, appendiceal orifice, and rectum                            were photographed. Scope In: 10:53:14 AM Scope Out: 11:21:54 AM Scope Withdrawal Time: 0 hours 19 minutes 29 seconds  Total Procedure Duration: 0 hours 28 minutes 40 seconds  Findings:                 The perianal and digital rectal examinations were                            normal.                           There was a medium-sized lipoma, in the ascending  colon.                           A 3 mm polyp was found in the ascending colon. The                            polyp was sessile. The polyp was removed with a                            cold snare. Resection and retrieval were complete.                           A 3 mm polyp was found in the hepatic flexure. The                            polyp was sessile. The polyp was removed with a                            cold snare. Resection and retrieval were complete.                           A diminutive polyp was found in the transverse                            colon. The polyp was sessile. The polyp was removed                            with a cold biopsy forceps. Resection and retrieval                            were complete.                           Three sessile polyps were found in the transverse                            colon. The polyps were 3 mm in size. These polyps                             were removed with a cold snare. Resection and                            retrieval were complete.                           Multiple small-mouthed diverticula were found in                            the sigmoid colon and transverse colon.                           The colon was tortuous with restricted mobility of  the distal left colon.                           Internal hemorrhoids were found during                            retroflexion. The hemorrhoids were small.                           The exam was otherwise without abnormality.                           Biopsies for histology were taken with a cold                            forceps from the right colon, left colon and                            transverse colon for evaluation of microscopic                            colitis. Complications:            No immediate complications. Estimated blood loss:                            Minimal. Estimated Blood Loss:     Estimated blood loss was minimal. Impression:               - Medium-sized lipoma in the ascending colon.                           - One 3 mm polyp in the ascending colon, removed                            with a cold snare. Resected and retrieved.                           - One 3 mm polyp at the hepatic flexure, removed                            with a cold snare. Resected and retrieved.                           - One diminutive polyp in the transverse colon,                            removed with a cold biopsy forceps. Resected and                            retrieved.                           - Three 3 mm polyps in the transverse colon,  removed with a cold snare. Resected and retrieved.                           - Diverticulosis in the sigmoid colon and in the                            transverse colon.                           - Tortuous colon with restricted mobility of the                             left colon.                           - Internal hemorrhoids.                           - The examination was otherwise normal.                           - Biopsies were taken with a cold forceps from the                            right colon, left colon and transverse colon for                            evaluation of microscopic colitis. Recommendation:           - Patient has a contact number available for                            emergencies. The signs and symptoms of potential                            delayed complications were discussed with the                            patient. Return to normal activities tomorrow.                            Written discharge instructions were provided to the                            patient.                           - Resume previous diet.                           - Continue present medications.                           - Await pathology results. Viviann Spare P. Araeya Lamb, MD 03/13/2023 11:29:17 AM This report has been signed electronically.

## 2023-03-13 NOTE — Progress Notes (Signed)
Pt states she had a cortisone shot to her Right foot on Nura C[place] 03/11/23.

## 2023-03-13 NOTE — Progress Notes (Signed)
Called to room to assist during endoscopic procedure.  Patient ID and intended procedure confirmed with present staff. Received instructions for my participation in the procedure from the performing physician.  

## 2023-03-13 NOTE — Progress Notes (Signed)
[facility] History and Physical   Primary Care Physician:  Merri Brunette, MD   Reason for Procedure:   History of colon polyps  Plan:    colonoscopy     HPI: Ashley Mathews is a 62 y.o. female  here for colonoscopy surveillance - history of an adenoma removed 12/2014. She does also endorse some chronic loose stools. Using cholestyramine over time which works well for her, history of cholecystectomy.  No family history of colon cancer known. Otherwise feels well without any cardiopulmonary symptoms.   I have discussed risks / benefits of anesthesia and endoscopic procedure with Ashley Mathews and they wish to proceed with the exams as outlined today.    Past Medical History:  Diagnosis Date   Allergic rhinitis    Asthma    Chronic headache    Colon polyps    Depression    Fibromyalgia    Fibromyalgia    Gallstones    Headache, chronic daily    HLD (hyperlipidemia)    IBS (irritable bowel syndrome)    Lupus    Sleep apnea    Thyroid disorder     Past Surgical History:  Procedure Laterality Date   bladder prolapse repair     CERVICAL FUSION  11/2015   CESAREAN SECTION  1987   CHOLECYSTECTOMY  2011   LAPAROSCOPIC ENDOMETRIOSIS FULGURATION     LAPAROSCOPIC TOTAL HYSTERECTOMY  1999   MYRINGOTOMY      Prior to Admission medications   Medication Sig Start Date End Date Taking? Authorizing Provider  Bempedoic Acid (NEXLETOL) 180 MG TABS Take by mouth.   Yes [provider]  cetirizine (ZYRTEC) 10 MG tablet Take 10 mg by mouth daily.   Yes [provider]  Cholecalciferol (VITAMIN D3) 5000 units CAPS Take 1 capsule by mouth daily.   Yes [provider]  cholestyramine Lanetta Inch) 4 GM/DOSE powder Take 1 g by mouth daily. 12/20/22  Yes [provider]  Estradiol (IMVEXXY MAINTENANCE PACK) 4 MCG INST Place vaginally.   Yes [provider]  famotidine (PEPCID) 20 MG tablet Take 20 mg by mouth 2 (two) times daily.  OTC   Yes [provider]  imipramine (TOFRANIL) 25 MG tablet Take 25 mg by mouth at bedtime.    Yes [provider]  levothyroxine (SYNTHROID, LEVOTHROID) 100 MCG tablet Take 88 mcg by mouth daily. Monday-Friday 04/20/09  Yes [provider]  sertraline (ZOLOFT) 100 MG tablet Take 100 mg by mouth daily. 05/31/18  Yes [provider]  chlorpheniramine-HYDROcodone (TUSSIONEX) 10-8 MG/5ML SUER  05/09/17   [provider]  dicyclomine (BENTYL) 10 MG capsule Take 1 capsule (10 mg total) by mouth daily. 02/07/23   Zehr, Princella Pellegrini, PA-C  diphenhydrAMINE (BENADRYL) 25 MG tablet Take 25 mg by mouth as needed.    [provider]  KLOR-CON 10 10 MEQ tablet  11/30/15   [provider]  meloxicam (MOBIC) 15 MG tablet Take 1 tablet (15 mg total) by mouth daily. Patient not taking: Reported on 03/13/2023 11/27/22   Enid Baas, MD  NAPROXEN SODIUM PO Take 220 mg by mouth as needed.    [provider]  Olopatadine HCl 0.2 % SOLN as needed. 03/29/10   [provider]  promethazine (PHENERGAN) 25 MG tablet Take 1 tablet (25 mg total) by mouth every 8 (eight) hours as needed for nausea or vomiting. 07/02/22   Sloan Leiter, DO    Current Outpatient Medications  Medication Sig Dispense  Refill   Bempedoic Acid (NEXLETOL) 180 MG TABS Take by mouth.     cetirizine (ZYRTEC) 10 MG tablet Take 10 mg by mouth daily.     Cholecalciferol (VITAMIN D3) 5000 units CAPS Take 1 capsule by mouth daily.     cholestyramine (QUESTRAN) 4 GM/DOSE powder Take 1 g by mouth daily.     Estradiol (IMVEXXY MAINTENANCE PACK) 4 MCG INST Place vaginally.     famotidine (PEPCID) 20 MG tablet Take 20 mg by mouth 2 (two) times daily. OTC     imipramine (TOFRANIL) 25 MG tablet Take 25 mg by mouth at bedtime.      levothyroxine (SYNTHROID, LEVOTHROID) 100 MCG tablet Take 88 mcg by mouth daily. Monday-Friday     sertraline (ZOLOFT) 100 MG tablet Take 100 mg by mouth  daily.     chlorpheniramine-HYDROcodone (TUSSIONEX) 10-8 MG/5ML SUER   0   dicyclomine (BENTYL) 10 MG capsule Take 1 capsule (10 mg total) by mouth daily. 30 capsule 11   diphenhydrAMINE (BENADRYL) 25 MG tablet Take 25 mg by mouth as needed.     KLOR-CON 10 10 MEQ tablet      meloxicam (MOBIC) 15 MG tablet Take 1 tablet (15 mg total) by mouth daily. (Patient not taking: Reported on 03/13/2023) 30 tablet 2   NAPROXEN SODIUM PO Take 220 mg by mouth as needed.     Olopatadine HCl 0.2 % SOLN as needed.     promethazine (PHENERGAN) 25 MG tablet Take 1 tablet (25 mg total) by mouth every 8 (eight) hours as needed for nausea or vomiting. 8 tablet 0   Current Facility-Administered Medications  Medication Dose Route Frequency Provider Last Rate Last Admin   0.9 %  sodium chloride infusion  500 mL Intravenous Once Fadil Macmaster, Willaim Rayas, MD       Facility-Administered Medications Ordered in Other Visits  Medication Dose Route Frequency Provider Last Rate Last Admin   gadopentetate dimeglumine (MAGNEVIST) injection 15 mL  15 mL Intravenous Once PRN Anson Fret, MD        Allergies as of 03/13/2023 - Review Complete 03/13/2023  Allergen Reaction Noted   Sulfa antibiotics Anaphylaxis 12/28/2014   Sulfonamide derivatives Anaphylaxis    Amoxicillin  05/04/2008   Amoxicillin-pot clavulanate Hives 12/28/2014   Cephalexin Hives 12/28/2014   Ciprofloxacin     Clarithromycin Hives    Doxycycline  06/07/2008   Erythromycin Hives and Other (See Comments)    Indomethacin     Iodine Other (See Comments)    Lidocaine  02/20/2012   Penicillins     Xylocaine  [lidocaine hcl] Other (See Comments) 12/28/2014   Zithromax [azithromycin] Hives 02/20/2012   Zofran [ondansetron hcl] Palpitations 07/02/2022    Family History  Problem Relation Age of Onset   Heart disease Mother    Hypertension Mother    Stroke Mother    Allergic rhinitis Mother    Scleroderma Mother    Colon polyps Mother    Diabetes  Mother    Irritable bowel syndrome Mother    Diverticulitis Mother    Sleep apnea Mother    Heart murmur Brother    Heart attack Maternal Grandfather    Lung cancer Maternal Aunt    Heart disease Maternal Aunt    Lung cancer Maternal Uncle    Heart disease Maternal Uncle    Heart disease Maternal Uncle    Heart disease Maternal Uncle    Diabetes Maternal Uncle    Liver disease Neg Hx  Colon cancer Neg Hx    Esophageal cancer Neg Hx     Social History   Socioeconomic History   Marital status: Divorced    Spouse name: Not on file   Number of children: 1   Years of education: Not on file   Highest education level: Not on file  Occupational History   Occupation: [employer]  Tobacco Use   Smoking status: Never   Smokeless tobacco: Never  Vaping Use   Vaping status: Never Used  Substance and Sexual Activity   Alcohol use: Yes    Comment: occationally   Drug use: No   Sexual activity: Yes  Other Topics Concern   Not on file  Social History Narrative   Not on file   Social Determinants of Health   Financial Resource Strain: Not on file  Food Insecurity: Not on file  Transportation Needs: Not on file  Physical Activity: Not on file  Stress: Not on file  Social Connections: Not on file  Intimate Partner Violence: Not on file    Review of Systems: All other review of systems negative except as mentioned in the HPI.  Physical Exam: Vital signs BP 121/63   Pulse (!) 102   Temp 97.9 F (36.6 C)   Ht 5\' 4"  (1.626 m)   Wt 160 lb (72.6 kg)   SpO2 97%   BMI 27.46 kg/m   General:   Alert,  Well-developed, pleasant and cooperative in NAD Lungs:  Clear throughout to auscultation.   Heart:  Regular rate and rhythm Abdomen:  Soft, nontender and nondistended.   Neuro/Psych:  Alert and cooperative. Normal mood and affect. A and O x 3  Harlin Rain, MD [facility]

## 2023-03-17 DIAGNOSIS — S62109A Fracture of unspecified carpal bone, unspecified wrist, initial encounter for closed fracture: Secondary | ICD-10-CM

## 2023-03-17 DIAGNOSIS — M25462 Effusion, left knee: Secondary | ICD-10-CM

## 2023-03-17 HISTORY — DX: Fracture of unspecified carpal bone, unspecified wrist, initial encounter for closed fracture: S62.109A

## 2023-03-17 HISTORY — DX: Effusion, left knee: M25.462

## 2023-03-18 ENCOUNTER — Telehealth: Payer: Self-pay

## 2023-03-18 NOTE — Telephone Encounter (Signed)
Post procedure follow up call, no answer 

## 2023-03-19 LAB — SURGICAL PATHOLOGY

## 2023-04-20 ENCOUNTER — Encounter (HOSPITAL_COMMUNITY): Payer: Self-pay

## 2023-04-20 ENCOUNTER — Ambulatory Visit (INDEPENDENT_AMBULATORY_CARE_PROVIDER_SITE_OTHER): Payer: 59

## 2023-04-20 ENCOUNTER — Ambulatory Visit (HOSPITAL_COMMUNITY)
Admission: EM | Admit: 2023-04-20 | Discharge: 2023-04-20 | Disposition: A | Discharge status: Home / Self Care | Payer: 59 | Attending: Emergency Medicine | Admitting: Emergency Medicine

## 2023-04-20 ENCOUNTER — Ambulatory Visit (HOSPITAL_COMMUNITY): Payer: 59

## 2023-04-20 DIAGNOSIS — M25562 Pain in left knee: Secondary | ICD-10-CM

## 2023-04-20 DIAGNOSIS — M25462 Effusion, left knee: Secondary | ICD-10-CM

## 2023-04-20 NOTE — ED Triage Notes (Signed)
 Pt states that she fell and injured her left knee. Pt states that she has some left knee pain x2 weeks

## 2023-04-20 NOTE — ED Provider Notes (Signed)
 [facility]    CSN: [id number] Arrival date & time: 04/20/23  1354      History   Chief Complaint Chief Complaint  Patient presents with   Knee Injury    Left knee injury    HPI Ashley Mathews is a 63 y.o. female.   Patient presents to clinic over concern of left knee pain and swelling.  She had a fall on December 23 where she missed a step at the [facility] center and fell onto her right hand and knees.  She had an abrasion to her left knee, was wearing jeans and thermals and these were still intact.  She did not hit her head.  She was evaluated on 12/26 and an [facility] for right wrist pain and numbness where she had a negative right hand x-ray and was given IM Toradol .  Noted to have left knee pain and swelling at this time, was ambulatory.  Since the fall she has been taking Excedrin migraine (reports the aspirin in this works well for her) and icing her knee.  Presents to clinic today because the left knee pain and swelling have not improved.  Feels like there may be a piece of glass in her knee and has pain over her patella.  She does have an abrasion that continues to her left knee.  She has been dressing this with a Band-Aid.  Ambulatory with a limp.  The history is provided by the patient and medical records.    Past Medical History:  Diagnosis Date   Allergic rhinitis    Asthma    Chronic headache    Colon polyps    Depression    Fibromyalgia    Fibromyalgia    Gallstones    Headache, chronic daily    HLD (hyperlipidemia)    IBS (irritable bowel syndrome)    Lupus    Sleep apnea    Thyroid  disorder     Patient Active Problem List   Diagnosis Date Noted   Heel spur, right 02/18/2023   History of colonic polyps 02/07/2023   Irritable bowel syndrome with diarrhea 02/07/2023   Osteoporosis 06/01/2021   Hypersomnia 06/14/2017   Obstructive sleep apnea 06/14/2017   Knee pain, bilateral 12/22/2015   Palpitations 05/13/2013    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   Dyspnea 08/04/2012   Plantar fasciitis of right foot 02/20/2012   OTITIS MEDIA, CHRONIC 07/09/2008   GERD 07/09/2008   ALLERGIC RHINITIS 05/04/2008   ASTHMA 05/04/2008   LUPUS (SLE) 05/04/2008   FIBROMYALGIA 05/04/2008   HEADACHE, CHRONIC 05/04/2008   COUGH 05/04/2008    Past Surgical History:  Procedure Laterality Date   bladder prolapse repair     CERVICAL FUSION  11/2015   CESAREAN SECTION  1987   CHOLECYSTECTOMY  2011   LAPAROSCOPIC ENDOMETRIOSIS FULGURATION     LAPAROSCOPIC TOTAL HYSTERECTOMY  1999   MYRINGOTOMY      OB History     Gravida  4   Para      Term      Preterm      AB  3   Living  1      SAB      IAB      Ectopic      Multiple      Live Births  1            Home Medications    Prior to Admission medications   Medication Sig Start Date End Date Taking?  Authorizing Provider  Bempedoic Acid (NEXLETOL) 180 MG TABS Take by mouth.   Yes [provider]  cetirizine (ZYRTEC) 10 MG tablet Take 10 mg by mouth daily.   Yes [provider]  Cholecalciferol (VITAMIN D3) 5000 units CAPS Take 1 capsule by mouth daily.   Yes [provider]  cholestyramine ORMA) 4 GM/DOSE powder Take 1 g by mouth daily. 12/20/22  Yes [provider]  dicyclomine  (BENTYL ) 10 MG capsule Take 1 capsule (10 mg total) by mouth daily. 02/07/23  Yes Zehr, Jessica D, PA-C  diphenhydrAMINE  (BENADRYL ) 25 MG tablet Take 25 mg by mouth as needed.   Yes [provider]  Estradiol (IMVEXXY MAINTENANCE PACK) 4 MCG INST Place vaginally.   Yes [provider]  famotidine (PEPCID) 20 MG tablet Take 20 mg by mouth 2 (two) times daily. OTC   Yes [provider]  imipramine (TOFRANIL) 25 MG tablet Take 25 mg by mouth at bedtime.    Yes [provider]  KLOR-CON  10 10 MEQ tablet  11/30/15  Yes [provider]  levothyroxine (SYNTHROID, LEVOTHROID) 100 MCG tablet Take  88 mcg by mouth daily. Monday-Friday 04/20/09  Yes [provider]  meloxicam  (MOBIC ) 15 MG tablet Take 1 tablet (15 mg total) by mouth daily. 11/27/22  Yes Harvey Seltzer, MD  NAPROXEN SODIUM PO Take 220 mg by mouth as needed.   Yes [provider]  Olopatadine HCl 0.2 % SOLN as needed. 03/29/10  Yes [provider]  promethazine  (PHENERGAN ) 25 MG tablet Take 1 tablet (25 mg total) by mouth every 8 (eight) hours as needed for nausea or vomiting. 07/02/22  Yes Elnor Savant A, DO  sertraline (ZOLOFT) 100 MG tablet Take 100 mg by mouth daily. 05/31/18  Yes [provider]  chlorpheniramine-HYDROcodone  (TUSSIONEX) 10-8 MG/5ML SUER  05/09/17   [provider]    Family History Family History  Problem Relation Age of Onset   Heart disease Mother    Hypertension Mother    Stroke Mother    Allergic rhinitis Mother    Scleroderma Mother    Colon polyps Mother    Diabetes Mother    Irritable bowel syndrome Mother    Diverticulitis Mother    Sleep apnea Mother    Heart murmur Brother    Heart attack Maternal Grandfather    Lung cancer Maternal Aunt    Heart disease Maternal Aunt    Lung cancer Maternal Uncle    Heart disease Maternal Uncle    Heart disease Maternal Uncle    Heart disease Maternal Uncle    Diabetes Maternal Uncle    Liver disease Neg Hx    Colon cancer Neg Hx    Esophageal cancer Neg Hx     Social History Social History   Tobacco Use   Smoking status: Never   Smokeless tobacco: Never  Vaping Use   Vaping status: Never Used  Substance Use Topics   Alcohol use: Not Currently    Comment: occasionally   Drug use: No     Allergies   Sulfa antibiotics, Sulfonamide derivatives, Amoxicillin, Amoxicillin-pot clavulanate, Cephalexin, Ciprofloxacin, Clarithromycin, Doxycycline, Erythromycin, Indomethacin, Iodine, Lidocaine, Penicillins, Xylocaine  [lidocaine hcl], Zithromax [azithromycin], and Zofran  [ondansetron  hcl]   Review of  Systems Review of Systems  Per HPI   Physical Exam Triage Vital Signs ED Triage Vitals  Encounter Vitals Group     BP 04/20/23 1451 (!) 143/87     Systolic BP Percentile --  Diastolic BP Percentile --      Pulse Rate 04/20/23 1451 (!) 105     Resp 04/20/23 1451 17     Temp 04/20/23 1451 98.2 F (36.8 C)     Temp Source 04/20/23 1451 Oral     SpO2 04/20/23 1451 96 %     Weight 04/20/23 1450 153 lb (69.4 kg)     Height 04/20/23 1450 5' 4 (1.626 m)     Head Circumference --      Peak Flow --      Pain Score 04/20/23 1449 6     Pain Loc --      Pain Education --      Exclude from Growth Chart --    No data found.  Updated Vital Signs BP (!) 143/87 (BP Location: Right Arm)   Pulse (!) 105   Temp 98.2 F (36.8 C) (Oral)   Resp 17   Ht 5' 4 (1.626 m)   Wt 153 lb (69.4 kg)   SpO2 96%   BMI 26.26 kg/m   Visual Acuity Right Eye Distance:   Left Eye Distance:   Bilateral Distance:    Right Eye Near:   Left Eye Near:    Bilateral Near:     Physical Exam Vitals and nursing note reviewed.  Constitutional:      Appearance: Normal appearance.  HENT:     Head: Normocephalic and atraumatic.     Right Ear: External ear normal.     Left Ear: External ear normal.     Nose: Nose normal.     Mouth/Throat:     Mouth: Mucous membranes are moist.  Eyes:     Conjunctiva/sclera: Conjunctivae normal.  Cardiovascular:     Rate and Rhythm: Normal rate.  Pulmonary:     Effort: Pulmonary effort is normal. No respiratory distress.  Musculoskeletal:        General: Swelling, tenderness and signs of injury present. Normal range of motion.     Left knee: Swelling, effusion and bony tenderness present.       Legs:     Comments: Effusion, mild bruising, TTP worse over medial patella area.   Healing wound to anterior knee cap.   Skin:    General: Skin is warm and dry.     Capillary Refill: Capillary refill takes less than 2 seconds.  Neurological:     General: No focal  deficit present.     Mental Status: She is alert and oriented to person, place, and time.  Psychiatric:        Mood and Affect: Mood normal.        Behavior: Behavior normal. Behavior is cooperative.      UC Treatments / Results  Labs (all labs ordered are listed, but only abnormal results are displayed) Labs Reviewed - No data to display  EKG   Radiology DG Knee AP/LAT W/Sunrise Left Result Date: 04/20/2023 CLINICAL DATA:  Knee pain and swelling, fall 2 weeks ago EXAM: LEFT KNEE 3 VIEWS COMPARISON:  None Available. FINDINGS: No fracture or dislocation of the left knee. Joint spaces are preserved. Small nonspecific knee joint effusion. No evidence of arthropathy or other focal bone abnormality. Soft tissues are unremarkable. IMPRESSION: No fracture or dislocation of the left knee. Joint spaces are preserved. Small nonspecific knee joint effusion. Electronically Signed   By: Marolyn JONETTA Jaksch M.D.   On: 04/20/2023 15:52    Procedures Procedures (including critical care time)  Medications Ordered in UC Medications -  No data to display  Initial Impression / Assessment and Plan / UC Course  I have reviewed the triage vital signs and the nursing notes.  Pertinent labs & imaging results that were available during my care of the patient were reviewed by me and considered in my medical decision making (see chart for details).  Vitals and triage reviewed, patient is hemodynamically stable.  Left knee pain has been persistent for almost 2 weeks.  Knee effusion present on physical exam.  Imaging shows no acute fractures or deformities.  Will trial Ace wrap for compression and RICE method.  Patient has orthopedic follow-up scheduled for Monday.  Ambulatory.  There is a small abrasion, encourage proper wound care.  Does not appear to be infected or cellulitic at this time.  Plan of care, follow-up care and return precautions given, no questions at this time.     Final Clinical Impressions(s) / UC  Diagnoses   Final diagnoses:  Acute pain of left knee  Knee effusion, left     Discharge Instructions      Your x-ray did not show any acute fractures or deformity.  It does show that you have a small effusion, collection of fluid most likely from your trauma.  Please rest, ice, compress and elevate your knee.  If the pain and swelling persist into next week please follow-up with an orthopedic for further evaluation.  Return to clinic for any new or urgent symptoms.     ED Prescriptions   None    PDMP not reviewed this encounter.   Dreama, Siddhartha Hoback  N, FNP 04/20/23 [phone]

## 2023-04-20 NOTE — Discharge Instructions (Addendum)
 Your x-ray did not show any acute fractures or deformity.  It does show that you have a small effusion, collection of fluid most likely from your trauma.  Please rest, ice, compress and elevate your knee.  If the pain and swelling persist into next week please follow-up with an orthopedic for further evaluation.  Return to clinic for any new or urgent symptoms.

## 2023-04-22 ENCOUNTER — Ambulatory Visit: Payer: 59 | Admitting: Family Medicine

## 2023-04-22 ENCOUNTER — Encounter: Payer: 59 | Admitting: Obstetrics and Gynecology

## 2023-04-23 ENCOUNTER — Encounter: Payer: Self-pay | Admitting: Family Medicine

## 2023-04-23 ENCOUNTER — Ambulatory Visit: Payer: 59 | Admitting: Family Medicine

## 2023-04-23 VITALS — BP 124/82 | Ht 64.0 in | Wt 153.0 lb

## 2023-04-23 DIAGNOSIS — M722 Plantar fascial fibromatosis: Secondary | ICD-10-CM

## 2023-04-23 DIAGNOSIS — M25531 Pain in right wrist: Secondary | ICD-10-CM

## 2023-04-23 DIAGNOSIS — S8002XA Contusion of left knee, initial encounter: Secondary | ICD-10-CM

## 2023-04-23 NOTE — Progress Notes (Signed)
 DATE OF VISIT: 04/23/2023        Ashley Mathews DOB: 03-23-1961 MRN: [id number]  CC:  f/u Rt PF; new left knee pain, new Rt wrist pain  History of present Illness: Ashley Mathews is a 63 y.o. female who presents for a follow-up visit. Last seen by me 03/11/23 and underwent u/s guided Rt PF injection - was feeling better, but injured Lt knee after a fall 04/08/23 - was having increased pain  Had a fall 04/08/23 at [facility] Landed on her left knee and right wrist Went to [facility] 04/11/23 for the wrist pain - neg hand and wrist XR at urgent care - given brace - does not sound like a thumb spica brace - using brace, but not very comfortable - continues to have pain in the radial aspect of the wrist over anatomical snuff box - RHD - denies numbness/tingling  Continued to have left knee pain after the fall Seen at ER 04/20/23 for the left knee Landed on the knee, fell while walking to the carousel at the [facility] Pain and swelling in the knee  - xray at ER showed only small effusion - use ACE wrap prn - does help - knee has been improving, but still some pain at the end of the day  Knee now starting to feel somewhat better Still quite a bit of pain in the wrist Rt foot now starting to feel better again now that she is walking more normal  Medications:  Outpatient Encounter Medications as of 04/23/2023  Medication Sig   Bempedoic Acid (NEXLETOL) 180 MG TABS Take by mouth.   cetirizine (ZYRTEC) 10 MG tablet Take 10 mg by mouth daily.   chlorpheniramine-HYDROcodone  (TUSSIONEX) 10-8 MG/5ML SUER    Cholecalciferol (VITAMIN D3) 5000 units CAPS Take 1 capsule by mouth daily.   cholestyramine (QUESTRAN) 4 GM/DOSE powder Take 1 g by mouth daily.   dicyclomine  (BENTYL ) 10 MG capsule Take 1 capsule (10 mg total) by mouth daily.   diphenhydrAMINE  (BENADRYL ) 25 MG tablet Take 25 mg by mouth as needed.   Estradiol (IMVEXXY MAINTENANCE PACK) 4 MCG INST Place vaginally.    famotidine (PEPCID) 20 MG tablet Take 20 mg by mouth 2 (two) times daily. OTC   imipramine (TOFRANIL) 25 MG tablet Take 25 mg by mouth at bedtime.    KLOR-CON  10 10 MEQ tablet    levothyroxine (SYNTHROID, LEVOTHROID) 100 MCG tablet Take 88 mcg by mouth daily. Monday-Friday   meloxicam  (MOBIC ) 15 MG tablet Take 1 tablet (15 mg total) by mouth daily.   NAPROXEN SODIUM PO Take 220 mg by mouth as needed.   Olopatadine HCl 0.2 % SOLN as needed.   promethazine  (PHENERGAN ) 25 MG tablet Take 1 tablet (25 mg total) by mouth every 8 (eight) hours as needed for nausea or vomiting.   sertraline (ZOLOFT) 100 MG tablet Take 100 mg by mouth daily.   Facility-Administered Encounter Medications as of 04/23/2023  Medication   gadopentetate dimeglumine  (MAGNEVIST ) injection 15 mL    Allergies: is allergic to sulfa antibiotics, sulfonamide derivatives, amoxicillin, amoxicillin-pot clavulanate, cephalexin, ciprofloxacin, clarithromycin, doxycycline, erythromycin, indomethacin, iodine, lidocaine, penicillins, xylocaine  [lidocaine hcl], zithromax [azithromycin], and zofran  [ondansetron  hcl].  Physical Examination: Vitals: BP 124/82   Ht 5' 4 (1.626 m)   Wt 153 lb (69.4 kg)   BMI 26.26 kg/m  GENERAL:  Ashley Mathews is a 63 y.o. female appearing their stated age, alert and oriented x 3, in no apparent distress.  SKIN: no rashes or lesions, skin clean, dry, intact MSK:  HAND/WRIST:  Rt hand/wrist with soft tissue swelling throughout the wrist.  No increased redness or warmth.  (+)TTP over anatomical snuff box.  Decreased ROM of the wrist with pain.  No other bony TTP along the hand or wrist.  Normal grip strength.  Normal left wrist exam  KNEE:  Lt knee with FROM with mild pain at terminal flexion.  No medial or lateral joint line TTP.  (+)TTP along anterior knee over patella.  Neg McMurray, neg varus/valgus stress.  Neg Lachman, neg Posterior drawer.  Trace effusion.  Rt knee with FROM without pain, weakness,  instability  FOOT/ANKLE:  Rt foot with minimal pain along insertion of PF on medial calcaneus, no other tenderness Walking with slight limp Neurovascularly intact distally upper ext and lower ext bilaterally  Radiology: XRAY:  LT KNEE XR 04/20/23 showing: IMPRESSION: - No fracture or dislocation of the left knee.  - Joint spaces are preserved.  - Small nonspecific knee joint effusion.  RIGHT HAND & WRIST XR 04/11/23 showing: IMPRESSION: 1.  No acute fracture.  2.  No malalignment.  3.  Mild first MCP and first CMC degenerative changes.   Assessment & Plan Wrist pain, right Rt wrist pain s/p fall 04/08/23 with ongoing pain along anatomical snuffbox.  Neg xray 04/11/23.  Concern for occult scaphoid fracture due to ongoing pain, swelling, and anatomical snuff box tenderness  PLAN: - UC notes from 04/11/23 reviewed in CareEverywhere - IMAGING: MRI RT WRIST r/o scaphoid fx - fitted with thumb spica brace to help stabilize the area and decrease pain/swelling - f/u pending MRI - consider referral to Hand Surgery if scaphoid fx present Contusion of left knee, initial encounter Resolving left knee contusion s/p fall 04/09/23 - neg XR at ER 04/20/23  PLAN: - cont ACE wrap prn - heat or ice prn - OTC NSAIDs prn - cont to monitor Plantar fasciitis of right foot S/p u/s guided cortisone injection 03/11/23 - initially improving, then exacerbated by recent fall, now starting to improve again  PLAN: - cont custom orthotics - cont ice prn - expect should continue to improve as left knee improves - consider shockwave therapy if not improvig  Patient expressed understanding & agreement with above.  Encounter Diagnoses  Name Primary?   Wrist pain, right Yes   Contusion of left knee, initial encounter    Plantar fasciitis of right foot     Orders Placed This Encounter  Procedures   MR WRIST RIGHT WO CONTRAST

## 2023-04-24 NOTE — Assessment & Plan Note (Signed)
 S/p u/s guided cortisone injection 03/11/23 - initially improving, then exacerbated by recent fall, now starting to improve again  PLAN: - cont custom orthotics - cont ice prn - expect should continue to improve as left knee improves - consider shockwave therapy if not improvig

## 2023-04-25 NOTE — Progress Notes (Signed)
 [facility] Pre-Operative Exam  Subjective Chief Complaint: Ashley Mathews presents for a preoperative encounter.   History of Present Illness: Ashley Mathews is a 63 y.o. female who presents for preoperative visit.  She is scheduled to undergo Exam under anesthesia, midurethral sling, cystoscopy   on 05/13/23.  Her symptoms include stress urinary incontinence.   Simple CMG showed: CMG showed within normal limits sensation, and within normal limits cystometric capacity. Findings positive for stress incontinence, positive for detrusor overactivity.   Past Medical History:  Diagnosis Date   Allergic rhinitis    Asthma    Chronic headache    Colon polyps    Depression    Fibromyalgia    Fibromyalgia    Gallstones    Headache, chronic daily    HLD (hyperlipidemia)    IBS (irritable bowel syndrome)    Lupus    Sleep apnea    Thyroid  disorder      Past Surgical History:  Procedure Laterality Date   bladder prolapse repair     CERVICAL FUSION  11/2015   CESAREAN SECTION  1987   CHOLECYSTECTOMY  2011   LAPAROSCOPIC ENDOMETRIOSIS FULGURATION     LAPAROSCOPIC TOTAL HYSTERECTOMY  1999   MYRINGOTOMY      is allergic to sulfa antibiotics, sulfonamide derivatives, amoxicillin, amoxicillin-pot clavulanate, cephalexin, ciprofloxacin, clarithromycin, doxycycline, erythromycin, indomethacin, iodine, lidocaine, penicillins, xylocaine  [lidocaine hcl], zithromax [azithromycin], and zofran  [ondansetron  hcl].   Family History  Problem Relation Age of Onset   Heart disease Mother    Hypertension Mother    Stroke Mother    Allergic rhinitis Mother    Scleroderma Mother    Colon polyps Mother    Diabetes Mother    Irritable bowel syndrome Mother    Diverticulitis Mother    Sleep apnea Mother    Heart murmur Brother    Heart attack Maternal Grandfather    Lung cancer Maternal Aunt    Heart disease Maternal Aunt    Lung cancer Maternal Uncle    Heart disease  Maternal Uncle    Heart disease Maternal Uncle    Heart disease Maternal Uncle    Diabetes Maternal Uncle    Liver disease Neg Hx    Colon cancer Neg Hx    Esophageal cancer Neg Hx     Social History   Tobacco Use   Smoking status: Never   Smokeless tobacco: Never  Vaping Use   Vaping status: Never Used  Substance Use Topics   Alcohol use: Not Currently    Comment: occasionally   Drug use: No     Review of Systems was negative for a full 10 system review except as noted in the History of Present Illness.   Current Outpatient Medications:    acetaminophen  (TYLENOL ) 500 MG tablet, Take 1 tablet (500 mg total) by mouth every 6 (six) hours as needed (pain)., Disp: 30 tablet, Rfl: 0   Bempedoic Acid (NEXLETOL) 180 MG TABS, Take by mouth., Disp: , Rfl:    cetirizine (ZYRTEC) 10 MG tablet, Take 10 mg by mouth daily., Disp: , Rfl:    chlorpheniramine-HYDROcodone  (TUSSIONEX) 10-8 MG/5ML SUER, , Disp: , Rfl: 0   Cholecalciferol (VITAMIN D3) 5000 units CAPS, Take 1 capsule by mouth daily., Disp: , Rfl:    cholestyramine (QUESTRAN) 4 GM/DOSE powder, Take 1 g by mouth daily., Disp: , Rfl:    dicyclomine  (BENTYL ) 10 MG capsule, Take 1 capsule (10 mg total) by mouth daily., Disp: 30 capsule, Rfl: 11  diphenhydrAMINE  (BENADRYL ) 25 MG tablet, Take 25 mg by mouth as needed., Disp: , Rfl:    Estradiol (IMVEXXY MAINTENANCE PACK) 4 MCG INST, Place vaginally., Disp: , Rfl:    famotidine (PEPCID) 20 MG tablet, Take 20 mg by mouth 2 (two) times daily. OTC, Disp: , Rfl:    ibuprofen  (ADVIL ) 600 MG tablet, Take 1 tablet (600 mg total) by mouth every 6 (six) hours as needed., Disp: 30 tablet, Rfl: 0   imipramine (TOFRANIL) 25 MG tablet, Take 25 mg by mouth at bedtime. , Disp: , Rfl:    KLOR-CON  10 10 MEQ tablet, , Disp: , Rfl:    levothyroxine (SYNTHROID, LEVOTHROID) 100 MCG tablet, Take 88 mcg by mouth daily. Monday-Friday, Disp: , Rfl:    meloxicam  (MOBIC ) 15 MG tablet, Take 1 tablet (15 mg total)  by mouth daily., Disp: 30 tablet, Rfl: 2   NAPROXEN SODIUM PO, Take 220 mg by mouth as needed., Disp: , Rfl:    Olopatadine HCl 0.2 % SOLN, as needed., Disp: , Rfl:    oxyCODONE  (OXY IR/ROXICODONE ) 5 MG immediate release tablet, Take 1 tablet (5 mg total) by mouth every 4 (four) hours as needed for severe pain (pain score 7-10)., Disp: 15 tablet, Rfl: 0   polyethylene glycol powder (GLYCOLAX /MIRALAX ) 17 GM/SCOOP powder, Take 17 g by mouth daily. Drink 17g (1 scoop) dissolved in water  per day., Disp: 255 g, Rfl: 0   promethazine  (PHENERGAN ) 25 MG tablet, Take 1 tablet (25 mg total) by mouth every 8 (eight) hours as needed for nausea or vomiting., Disp: 8 tablet, Rfl: 0   sertraline (ZOLOFT) 100 MG tablet, Take 100 mg by mouth daily., Disp: , Rfl:  No current facility-administered medications for this visit.  Facility-Administered Medications Ordered in Other Visits:    gadopentetate dimeglumine  (MAGNEVIST ) injection 15 mL, 15 mL, Intravenous, Once PRN, Ines Onetha NOVAK, MD   Objective Vitals:   04/26/23 0838  BP: 131/82  Pulse: 87    Gen: NAD CV: S1 S2 RRR Lungs: Clear to auscultation bilaterally Abd: soft, nontender   Previous Pelvic Exam showed:  Normal external female genitalia; Bartholin's and Skene's glands normal in appearance; urethral meatus normal in appearance, no urethral masses or discharge.    CST: negative s/p hysterectomy: Speculum exam reveals normal vaginal mucosa with  atrophy and normal vaginal cuff.  Adnexa no mass, fullness, tenderness.     Pelvic floor strength II/V, puborectalis I/V external anal sphincter II/V   Pelvic floor musculature: Right levator non-tender, Right obturator non-tender, Left levator non-tender, Left obturator non-tender   POP-Q:    POP-Q   -3                                            Aa   -3                                           Ba   -7.5                                              C    2.5  Gh   3.5                                            Pb   7.5                                            tvl    -3                                            Ap   -3                                            Bp                                                  D          Rectal Exam:  Normal sphincter tone, no distal rectocele, enterocoele not present, no rectal masses, no sign of dyssynergia when asking the patient to bear down.    Assessment/ Plan  Assessment: The patient is a 63 y.o. year old scheduled to undergo Exam under anesthesia, midurethral sling, cystoscopy. Verbal consent was obtained for these procedures.  Plan: General Surgical Consent: The patient has previously been counseled on alternative treatments, and the decision by the patient and provider was to proceed with the procedure listed above.  For all procedures, there are risks of bleeding, infection, damage to surrounding organs including but not limited to bowel, bladder, blood vessels, ureters and nerves, and need for further surgery if an injury were to occur. These risks are all low with minimally invasive surgery.   There are risks of numbness and weakness at any body site or buttock/rectal pain.  It is possible that baseline pain can be worsened by surgery, either with or without mesh. If surgery is vaginal, there is also a low risk of possible conversion to laparoscopy or open abdominal incision where indicated. Very rare risks include blood transfusion, blood clot, heart attack, pneumonia, or death.   There is also a risk of short-term postoperative urinary retention with need to use a catheter. About half of patients need to go home from surgery with a catheter, which is then later removed in the office. The risk of long-term need for a catheter is very low. There is also a risk of worsening of overactive bladder.   Sling: The effectiveness of a midurethral vaginal mesh sling is  approximately 85%, and thus, there will be times when you may leak urine after surgery, especially if your bladder is full or if you have a strong cough. There is a balance between making the sling tight enough to treat your leakage but not too tight so that you have long-term difficulty emptying your bladder. A mesh sling will not directly treat overactive bladder/urge incontinence and may worsen it.  There is an FDA safety notification on vaginal  mesh procedures for prolapse but NOT mesh slings. We have extensive experience and training with mesh placement and we have close postoperative follow up to identify any potential complications from mesh. It is important to realize that this mesh is a permanent implant that cannot be easily removed. There are rare risks of mesh exposure (2-4%), pain with intercourse (0-7%), and infection (<1%). The risk of mesh exposure if more likely in a woman with risks for poor healing (prior radiation, poorly controlled diabetes, or immunocompromised). The risk of new or worsened chronic pain after mesh implant is more common in women with baseline chronic pain and/or poorly controlled anxiety or depression. Approximately 2-4% of patients will experience longer-term post-operative voiding dysfunction that may require surgical revision of the sling. We also reviewed that postoperatively, her stream may not be as strong as before surgery.    We discussed consent for blood products. Risks for blood transfusion include allergic reactions, other reactions that can affect different body organs and managed accordingly, transmission of infectious diseases such as HIV or Hepatitis. However, the blood is screened. Patient consents for blood products.  Pre-operative instructions:  She was instructed to not take Aspirin/NSAIDs x 7days prior to surgery. Antibiotic prophylaxis was ordered as indicated.  Catheter use: Patient will go home with foley if needed after post-operative voiding  trial.  Post-operative instructions:  She was provided with specific post-operative instructions, including precautions and signs/symptoms for which we would recommend contacting us , in addition to daytime and after-hours contact phone numbers. This was provided on a handout.   Post-operative medications: Prescriptions for motrin , tylenol , miralax , and oxycodone  were sent to her pharmacy. Discussed using ibuprofen  and tylenol  on a schedule to limit use of narcotics.   Laboratory testing:  We will check labs: As requested by anesthesia   Preoperative clearance:  She does not require surgical clearance.    Post-operative follow-up:  A post-operative appointment will be made for 6 weeks from the date of surgery. If she needs a post-operative nurse visit for a voiding trial, that will be set up after she leaves the hospital.    Patient will call the clinic or use MyChart should anything change or any new issues arise.   Fumi Guadron G Zuleica Seith, NP

## 2023-04-26 ENCOUNTER — Encounter: Payer: Self-pay | Admitting: Obstetrics and Gynecology

## 2023-04-26 ENCOUNTER — Ambulatory Visit (INDEPENDENT_AMBULATORY_CARE_PROVIDER_SITE_OTHER): Payer: 59 | Admitting: Obstetrics and Gynecology

## 2023-04-26 VITALS — BP 131/82 | HR 87

## 2023-04-26 DIAGNOSIS — Z01818 Encounter for other preprocedural examination: Secondary | ICD-10-CM

## 2023-04-26 MED ORDER — OXYCODONE HCL 5 MG PO TABS
5.0000 mg | ORAL_TABLET | ORAL | 0 refills | Status: DC | PRN
Start: 2023-04-26 — End: 2023-06-26

## 2023-04-26 MED ORDER — ACETAMINOPHEN 500 MG PO TABS: 500.0000 mg | ORAL_TABLET | Freq: Four times a day (QID) | ORAL | 0 refills | Status: DC | PRN

## 2023-04-26 MED ORDER — POLYETHYLENE GLYCOL 3350 17 GM/SCOOP PO POWD: 17.0000 g | Freq: Every day | ORAL | 0 refills | Status: AC

## 2023-04-26 MED ORDER — IBUPROFEN 600 MG PO TABS: 600.0000 mg | ORAL_TABLET | Freq: Four times a day (QID) | ORAL | 0 refills | Status: AC | PRN

## 2023-04-26 NOTE — H&P (Signed)
 [facility] H&P  Subjective Chief Complaint: Ashley Mathews presents for a preoperative encounter.   History of Present Illness: Ashley Mathews is a 63 y.o. female who presents for preoperative visit.  She is scheduled to undergo Exam under anesthesia, midurethral sling, cystoscopy   on 05/13/23.  Her symptoms include stress urinary incontinence.   Simple CMG showed: CMG showed within normal limits sensation, and within normal limits cystometric capacity. Findings positive for stress incontinence, positive for detrusor overactivity.   Past Medical History:  Diagnosis Date   Allergic rhinitis    Asthma    Chronic headache    Colon polyps    Depression    Fibromyalgia    Fibromyalgia    Gallstones    Headache, chronic daily    HLD (hyperlipidemia)    IBS (irritable bowel syndrome)    Lupus    Sleep apnea    Thyroid  disorder      Past Surgical History:  Procedure Laterality Date   bladder prolapse repair     CERVICAL FUSION  11/2015   CESAREAN SECTION  1987   CHOLECYSTECTOMY  2011   LAPAROSCOPIC ENDOMETRIOSIS FULGURATION     LAPAROSCOPIC TOTAL HYSTERECTOMY  1999   MYRINGOTOMY      is allergic to sulfa antibiotics, sulfonamide derivatives, amoxicillin, amoxicillin-pot clavulanate, cephalexin, ciprofloxacin, clarithromycin, doxycycline, erythromycin, indomethacin, iodine, lidocaine, penicillins, xylocaine  [lidocaine hcl], zithromax [azithromycin], and zofran  [ondansetron  hcl].   Family History  Problem Relation Age of Onset   Heart disease Mother    Hypertension Mother    Stroke Mother    Allergic rhinitis Mother    Scleroderma Mother    Colon polyps Mother    Diabetes Mother    Irritable bowel syndrome Mother    Diverticulitis Mother    Sleep apnea Mother    Heart murmur Brother    Heart attack Maternal Grandfather    Lung cancer Maternal Aunt    Heart disease Maternal Aunt    Lung cancer Maternal Uncle    Heart disease Maternal Uncle     Heart disease Maternal Uncle    Heart disease Maternal Uncle    Diabetes Maternal Uncle    Liver disease Neg Hx    Colon cancer Neg Hx    Esophageal cancer Neg Hx     Social History   Tobacco Use   Smoking status: Never   Smokeless tobacco: Never  Vaping Use   Vaping status: Never Used  Substance Use Topics   Alcohol use: Not Currently    Comment: occasionally   Drug use: No     Review of Systems was negative for a full 10 system review except as noted in the History of Present Illness.  No current facility-administered medications for this encounter.  Current Outpatient Medications:    acetaminophen  (TYLENOL ) 500 MG tablet, Take 1 tablet (500 mg total) by mouth every 6 (six) hours as needed (pain)., Disp: 30 tablet, Rfl: 0   Bempedoic Acid (NEXLETOL) 180 MG TABS, Take by mouth., Disp: , Rfl:    cetirizine (ZYRTEC) 10 MG tablet, Take 10 mg by mouth daily., Disp: , Rfl:    chlorpheniramine-HYDROcodone  (TUSSIONEX) 10-8 MG/5ML SUER, , Disp: , Rfl: 0   Cholecalciferol (VITAMIN D3) 5000 units CAPS, Take 1 capsule by mouth daily., Disp: , Rfl:    cholestyramine (QUESTRAN) 4 GM/DOSE powder, Take 1 g by mouth daily., Disp: , Rfl:    dicyclomine  (BENTYL ) 10 MG capsule, Take 1 capsule (10 mg total) by mouth  daily., Disp: 30 capsule, Rfl: 11   diphenhydrAMINE  (BENADRYL ) 25 MG tablet, Take 25 mg by mouth as needed., Disp: , Rfl:    Estradiol (IMVEXXY MAINTENANCE PACK) 4 MCG INST, Place vaginally., Disp: , Rfl:    famotidine (PEPCID) 20 MG tablet, Take 20 mg by mouth 2 (two) times daily. OTC, Disp: , Rfl:    ibuprofen  (ADVIL ) 600 MG tablet, Take 1 tablet (600 mg total) by mouth every 6 (six) hours as needed., Disp: 30 tablet, Rfl: 0   imipramine (TOFRANIL) 25 MG tablet, Take 25 mg by mouth at bedtime. , Disp: , Rfl:    KLOR-CON  10 10 MEQ tablet, , Disp: , Rfl:    levothyroxine (SYNTHROID, LEVOTHROID) 100 MCG tablet, Take 88 mcg by mouth daily. Monday-Friday, Disp: , Rfl:    meloxicam   (MOBIC ) 15 MG tablet, Take 1 tablet (15 mg total) by mouth daily., Disp: 30 tablet, Rfl: 2   NAPROXEN SODIUM PO, Take 220 mg by mouth as needed., Disp: , Rfl:    Olopatadine HCl 0.2 % SOLN, as needed., Disp: , Rfl:    oxyCODONE  (OXY IR/ROXICODONE ) 5 MG immediate release tablet, Take 1 tablet (5 mg total) by mouth every 4 (four) hours as needed for severe pain (pain score 7-10)., Disp: 15 tablet, Rfl: 0   polyethylene glycol powder (GLYCOLAX /MIRALAX ) 17 GM/SCOOP powder, Take 17 g by mouth daily. Drink 17g (1 scoop) dissolved in water  per day., Disp: 255 g, Rfl: 0   promethazine  (PHENERGAN ) 25 MG tablet, Take 1 tablet (25 mg total) by mouth every 8 (eight) hours as needed for nausea or vomiting., Disp: 8 tablet, Rfl: 0   sertraline (ZOLOFT) 100 MG tablet, Take 100 mg by mouth daily., Disp: , Rfl:   Facility-Administered Medications Ordered in Other Encounters:    gadopentetate dimeglumine  (MAGNEVIST ) injection 15 mL, 15 mL, Intravenous, Once PRN, Ines Onetha NOVAK, MD   Objective There were no vitals filed for this visit.   Gen: NAD CV: S1 S2 RRR Lungs: Clear to auscultation bilaterally Abd: soft, nontender   Previous Pelvic Exam showed:  Normal external female genitalia; Bartholin's and Skene's glands normal in appearance; urethral meatus normal in appearance, no urethral masses or discharge.    CST: negative s/p hysterectomy: Speculum exam reveals normal vaginal mucosa with  atrophy and normal vaginal cuff.  Adnexa no mass, fullness, tenderness.     Pelvic floor strength II/V, puborectalis I/V external anal sphincter II/V   Pelvic floor musculature: Right levator non-tender, Right obturator non-tender, Left levator non-tender, Left obturator non-tender   POP-Q:    POP-Q   -3                                            Aa   -3                                           Ba   -7.5                                              C    2.5  Gh    3.5                                            Pb   7.5                                            tvl    -3                                            Ap   -3                                            Bp                                                  D          Rectal Exam:  Normal sphincter tone, no distal rectocele, enterocoele not present, no rectal masses, no sign of dyssynergia when asking the patient to bear down.    Assessment/ Plan  Assessment: The patient is a 63 y.o. year old scheduled to undergo Exam under anesthesia, midurethral sling, cystoscopy. Verbal consent was obtained for these procedures.

## 2023-05-01 NOTE — Progress Notes (Signed)
 Ashley Mathews is a 63 y.o. female contacted and notified of that FMLA paper were received by the UROGYN dept from [employer] FMLA forms Pt notified there will be a 10-14 day turn around for forms to be ready.  Pt notified  she will be contacted as soon as the from are ready and have been faxed.  Pre-op date: 04/26/2023 Surgery date: 05/13/2023 Post op date:06/24/2023

## 2023-05-05 ENCOUNTER — Ambulatory Visit
Admission: RE | Admit: 2023-05-05 | Discharge: 2023-05-05 | Disposition: A | Discharge status: Home / Self Care | Payer: 59 | Source: Ambulatory Visit | Attending: Family Medicine | Admitting: Family Medicine

## 2023-05-05 DIAGNOSIS — M25531 Pain in right wrist: Secondary | ICD-10-CM

## 2023-05-10 ENCOUNTER — Encounter: Payer: Self-pay | Admitting: Family Medicine

## 2023-05-10 ENCOUNTER — Other Ambulatory Visit: Payer: Self-pay

## 2023-05-10 ENCOUNTER — Telehealth: Payer: Self-pay | Admitting: Family Medicine

## 2023-05-10 ENCOUNTER — Encounter (HOSPITAL_BASED_OUTPATIENT_CLINIC_OR_DEPARTMENT_OTHER): Payer: Self-pay | Admitting: Obstetrics and Gynecology

## 2023-05-10 DIAGNOSIS — S62024D Nondisplaced fracture of middle third of navicular [scaphoid] bone of right wrist, subsequent encounter for fracture with routine healing: Secondary | ICD-10-CM

## 2023-05-10 DIAGNOSIS — S62001A Unspecified fracture of navicular [scaphoid] bone of right wrist, initial encounter for closed fracture: Secondary | ICD-10-CM | POA: Insufficient documentation

## 2023-05-10 NOTE — Progress Notes (Signed)
Spoke w/ via phone for pre-op interview---[facility] needs dos----  ISTAT       Lab results------none COVID test -----patient states asymptomatic no test needed Arrive at -------1230 on Monday, 05/13/2023 NPO after MN NO Solid Food.  Clear liquids from MN until---1130 Med rec completed Medications to take morning of surgery -----Zyrtec prn, Famotidine, Synthroid, Xanax prn Diabetic medication -----n/a Patient instructed no nail polish to be worn day of surgery Patient instructed to bring photo id and insurance card day of surgery Patient aware to have Driver (ride ) / caregiver    for 24 hours after surgery - Chelsea, driver / Earna Coder, son will be caregiver. Patient Special Instructions -----none Pre-Op special Instructions -----none Patient verbalized understanding of instructions that were given at this phone interview. Patient denies chest pain, sob, fever, cough at the interview.

## 2023-05-10 NOTE — Progress Notes (Signed)
MRI showing nondisplaced scaphoid waist fx and possible nondisplaced fx of triquetrum - called patient 05/10/23 @ 11:35am, no answer.  LMOM indicating will send MyChart message - will need evaluation with Hand Surgeon for definitive tx

## 2023-05-10 NOTE — Telephone Encounter (Signed)
Spoke to patient on phone 05/10/23.  Reviewed MRI results showing scaphoid fx of Rt wrist.  Still having pain.  Still using brace, but needing to take off periodically due to discomfort at work.    She is scheduled for outpatient bladder sling surgery on Monday.  Recommend evaluation with Hand Surgery - Dr Amanda Pea with [facility] or Dr Trevor Mace with [facility].  She lives close to Dr Carlos Levering office, but works near Dr [employer] office.  She will be out of work for 6 weeks due to upcoming bladder surgery.  Advised to keep thumb spica brace on as close to 24hr/7 days a week as possible until seen by Ortho.  Patient expressed understanding and agreement.  Thanked me for the call.

## 2023-05-13 ENCOUNTER — Other Ambulatory Visit: Payer: Self-pay

## 2023-05-13 ENCOUNTER — Ambulatory Visit (HOSPITAL_BASED_OUTPATIENT_CLINIC_OR_DEPARTMENT_OTHER)
Admission: RE | Admit: 2023-05-13 | Discharge: 2023-05-13 | Disposition: A | Discharge status: Home / Self Care | Payer: 59 | Attending: Obstetrics and Gynecology | Admitting: Obstetrics and Gynecology

## 2023-05-13 ENCOUNTER — Encounter (HOSPITAL_BASED_OUTPATIENT_CLINIC_OR_DEPARTMENT_OTHER)
Admission: RE | Disposition: A | Discharge status: Home / Self Care | Payer: Self-pay | Source: Home / Self Care | Attending: Obstetrics and Gynecology

## 2023-05-13 ENCOUNTER — Ambulatory Visit (HOSPITAL_BASED_OUTPATIENT_CLINIC_OR_DEPARTMENT_OTHER): Payer: 59 | Admitting: Anesthesiology

## 2023-05-13 ENCOUNTER — Encounter (HOSPITAL_BASED_OUTPATIENT_CLINIC_OR_DEPARTMENT_OTHER): Payer: Self-pay | Admitting: Obstetrics and Gynecology

## 2023-05-13 DIAGNOSIS — M329 Systemic lupus erythematosus, unspecified: Secondary | ICD-10-CM | POA: Diagnosis not present

## 2023-05-13 DIAGNOSIS — N393 Stress incontinence (female) (male): Secondary | ICD-10-CM | POA: Diagnosis present

## 2023-05-13 DIAGNOSIS — Z01818 Encounter for other preprocedural examination: Secondary | ICD-10-CM

## 2023-05-13 DIAGNOSIS — M797 Fibromyalgia: Secondary | ICD-10-CM | POA: Insufficient documentation

## 2023-05-13 DIAGNOSIS — J45909 Unspecified asthma, uncomplicated: Secondary | ICD-10-CM | POA: Insufficient documentation

## 2023-05-13 DIAGNOSIS — G4730 Sleep apnea, unspecified: Secondary | ICD-10-CM | POA: Diagnosis not present

## 2023-05-13 HISTORY — DX: Other complications of anesthesia, initial encounter: T88.59XA

## 2023-05-13 HISTORY — DX: Palpitations: R00.2

## 2023-05-13 HISTORY — DX: Stress incontinence (female) (male): N39.3

## 2023-05-13 HISTORY — DX: Personal history of urinary calculi: Z87.442

## 2023-05-13 HISTORY — PX: CYSTOSCOPY: SHX5120

## 2023-05-13 HISTORY — DX: Hypothyroidism, unspecified: E03.9

## 2023-05-13 HISTORY — DX: Papillomavirus as the cause of diseases classified elsewhere: B97.7

## 2023-05-13 HISTORY — PX: BLADDER SUSPENSION: SHX72

## 2023-05-13 HISTORY — DX: Anxiety disorder, unspecified: F41.9

## 2023-05-13 HISTORY — DX: Presence of spectacles and contact lenses: Z97.3

## 2023-05-13 HISTORY — DX: Plantar fascial fibromatosis: M72.2

## 2023-05-13 LAB — POCT I-STAT, CHEM 8
BUN: 14 mg/dL (ref 8–23)
Calcium, Ion: 1.22 mmol/L (ref 1.15–1.40)
Chloride: 106 mmol/L (ref 98–111)
Creatinine, Ser: 0.9 mg/dL (ref 0.44–1.00)
Glucose, Bld: 99 mg/dL (ref 70–99)
HCT: 37 % (ref 36.0–46.0)
Hemoglobin: 12.6 g/dL (ref 12.0–15.0)
Potassium: 3.3 mmol/L — ABNORMAL LOW (ref 3.5–5.1)
Sodium: 141 mmol/L (ref 135–145)
TCO2: 23 mmol/L (ref 22–32)

## 2023-05-13 SURGERY — URETHROPEXY, USING TRANSVAGINAL TAPE
Anesthesia: General | Site: Vagina

## 2023-05-13 MED ORDER — FENTANYL CITRATE (PF) 100 MCG/2ML IJ SOLN
INTRAMUSCULAR | Status: AC
  Filled 2023-05-13: qty 2

## 2023-05-13 MED ORDER — GABAPENTIN 300 MG PO CAPS
300.0000 mg | ORAL_CAPSULE | ORAL | Status: AC
  Administered 2023-05-13: 300 mg via ORAL

## 2023-05-13 MED ORDER — FENTANYL CITRATE (PF) 100 MCG/2ML IJ SOLN: 25.0000 ug | INTRAMUSCULAR | Status: DC | PRN

## 2023-05-13 MED ORDER — AMISULPRIDE (ANTIEMETIC) 5 MG/2ML IV SOLN: 10.0000 mg | Freq: Once | INTRAVENOUS | Status: DC | PRN

## 2023-05-13 MED ORDER — GENTAMICIN SULFATE 40 MG/ML IJ SOLN
5.0000 mg/kg | INTRAVENOUS | Status: AC
  Administered 2023-05-13: 303.2 mg via INTRAVENOUS
  Filled 2023-05-13: qty 7.5

## 2023-05-13 MED ORDER — GABAPENTIN 300 MG PO CAPS
ORAL_CAPSULE | ORAL | Status: AC
  Filled 2023-05-13: qty 1

## 2023-05-13 MED ORDER — DEXAMETHASONE SODIUM PHOSPHATE 10 MG/ML IJ SOLN
INTRAMUSCULAR | Status: AC
  Filled 2023-05-13: qty 1

## 2023-05-13 MED ORDER — DEXAMETHASONE SODIUM PHOSPHATE 4 MG/ML IJ SOLN
INTRAMUSCULAR | Status: DC | PRN
  Administered 2023-05-13: 5 mg via INTRAVENOUS
  Administered 2023-05-13 (×2): 10 mg via INTRAVENOUS

## 2023-05-13 MED ORDER — FENTANYL CITRATE (PF) 100 MCG/2ML IJ SOLN
INTRAMUSCULAR | Status: DC | PRN
  Administered 2023-05-13: 25 ug via INTRAVENOUS
  Administered 2023-05-13: 50 ug via INTRAVENOUS
  Administered 2023-05-13: 25 ug via INTRAVENOUS

## 2023-05-13 MED ORDER — PHENAZOPYRIDINE HCL 100 MG PO TABS
ORAL_TABLET | ORAL | Status: AC
  Filled 2023-05-13: qty 2

## 2023-05-13 MED ORDER — ACETAMINOPHEN 500 MG PO TABS
ORAL_TABLET | ORAL | Status: AC
  Filled 2023-05-13: qty 2

## 2023-05-13 MED ORDER — PHENAZOPYRIDINE HCL 100 MG PO TABS
200.0000 mg | ORAL_TABLET | ORAL | Status: AC
  Administered 2023-05-13: 200 mg via ORAL

## 2023-05-13 MED ORDER — ONDANSETRON HCL 4 MG/2ML IJ SOLN
INTRAMUSCULAR | Status: AC
Start: 2023-05-13 — End: ?
  Filled 2023-05-13: qty 2

## 2023-05-13 MED ORDER — STERILE WATER FOR IRRIGATION IR SOLN
Status: DC | PRN
  Administered 2023-05-13: 500 mL

## 2023-05-13 MED ORDER — MIDAZOLAM HCL 2 MG/2ML IJ SOLN
INTRAMUSCULAR | Status: AC
Start: 2023-05-13 — End: ?
  Filled 2023-05-13: qty 2

## 2023-05-13 MED ORDER — ACETAMINOPHEN 500 MG PO TABS
1000.0000 mg | ORAL_TABLET | ORAL | Status: AC
  Administered 2023-05-13: 1000 mg via ORAL

## 2023-05-13 MED ORDER — PROPOFOL 10 MG/ML IV BOLUS
INTRAVENOUS | Status: DC | PRN
  Administered 2023-05-13: 150 mg via INTRAVENOUS

## 2023-05-13 MED ORDER — SODIUM CHLORIDE 0.9 % IR SOLN
Status: DC | PRN
  Administered 2023-05-13: 1000 mL

## 2023-05-13 MED ORDER — CLINDAMYCIN PHOSPHATE 900 MG/50ML IV SOLN
INTRAVENOUS | Status: AC
  Filled 2023-05-13: qty 50

## 2023-05-13 MED ORDER — BUPIVACAINE-EPINEPHRINE 0.25% -1:200000 IJ SOLN
INTRAMUSCULAR | Status: DC | PRN
  Administered 2023-05-13: 10 mL

## 2023-05-13 MED ORDER — MIDAZOLAM HCL 5 MG/5ML IJ SOLN
INTRAMUSCULAR | Status: DC | PRN
  Administered 2023-05-13: 2 mg via INTRAVENOUS
  Administered 2023-05-13 (×2): 1 mg via INTRAVENOUS

## 2023-05-13 MED ORDER — OXYCODONE HCL 5 MG PO TABS: 5.0000 mg | ORAL_TABLET | Freq: Once | ORAL | Status: DC | PRN

## 2023-05-13 MED ORDER — CLINDAMYCIN PHOSPHATE 900 MG/50ML IV SOLN
900.0000 mg | INTRAVENOUS | Status: AC
  Administered 2023-05-13: 900 mg via INTRAVENOUS

## 2023-05-13 MED ORDER — PROPOFOL 10 MG/ML IV BOLUS
INTRAVENOUS | Status: AC
Start: 2023-05-13 — End: ?
  Filled 2023-05-13: qty 20

## 2023-05-13 MED ORDER — OXYCODONE HCL 5 MG/5ML PO SOLN: 5.0000 mg | Freq: Once | ORAL | Status: DC | PRN

## 2023-05-13 MED ORDER — PHENYLEPHRINE HCL (PRESSORS) 10 MG/ML IV SOLN
INTRAVENOUS | Status: DC | PRN
  Administered 2023-05-13 (×4): 80 ug via INTRAVENOUS

## 2023-05-13 MED ORDER — LACTATED RINGERS IV SOLN
INTRAVENOUS | Status: DC
Start: 2023-05-13 — End: 2023-05-14

## 2023-05-13 SURGICAL SUPPLY — 31 items
BLADE CLIPPER SENSICLIP SURGIC (BLADE) ×2 IMPLANT
BLADE SURG 15 STRL LF DISP TIS (BLADE) ×2 IMPLANT
DERMABOND ADVANCED .7 DNX12 (GAUZE/BANDAGES/DRESSINGS) ×2 IMPLANT
ELECT REM PT RETURN 9FT ADLT (ELECTROSURGICAL)
ELECTRODE REM PT RTRN 9FT ADLT (ELECTROSURGICAL) IMPLANT
GAUZE 4X4 16PLY ~~LOC~~+RFID DBL (SPONGE) IMPLANT
GLOVE BIOGEL PI IND STRL 6.5 (GLOVE) ×2 IMPLANT
GLOVE ECLIPSE 6.0 STRL STRAW (GLOVE) ×2 IMPLANT
GOWN STRL REUS W/TWL LRG LVL3 (GOWN DISPOSABLE) ×2 IMPLANT
HOLDER FOLEY CATH W/STRAP (MISCELLANEOUS) ×2 IMPLANT
KIT TURNOVER CYSTO (KITS) ×2 IMPLANT
MANIFOLD NEPTUNE II (INSTRUMENTS) ×2 IMPLANT
NDL HYPO 22X1.5 SAFETY MO (MISCELLANEOUS) ×2 IMPLANT
NEEDLE HYPO 22X1.5 SAFETY MO (MISCELLANEOUS) ×2
NS IRRIG 1000ML POUR BTL (IV SOLUTION) ×2 IMPLANT
PACK CYSTO (CUSTOM PROCEDURE TRAY) ×2 IMPLANT
PACK VAGINAL WOMENS (CUSTOM PROCEDURE TRAY) ×2 IMPLANT
RETRACTOR LONE STAR DISPOSABLE (INSTRUMENTS) ×2 IMPLANT
RETRACTOR STAY HOOK 5MM (MISCELLANEOUS) ×2 IMPLANT
SCRUB CHG 4% DYNA-HEX 4OZ (MISCELLANEOUS) ×2 IMPLANT
SET IRRIG Y TYPE TUR BLADDER L (SET/KITS/TRAYS/PACK) ×2 IMPLANT
SLEEVE SCD COMPRESS KNEE MED (STOCKING) ×2 IMPLANT
SLING INCONTINENCE KIM KNTLS (Miscellaneous) IMPLANT
SPIKE FLUID TRANSFER (MISCELLANEOUS) ×2 IMPLANT
SUCTION TUBE FRAZIER 10FR DISP (SUCTIONS) ×2 IMPLANT
SURGIFLO W/THROMBIN 8M KIT (HEMOSTASIS) IMPLANT
SUT ABS MONO DBL WITH NDL 48IN (SUTURE) IMPLANT
SUT VIC AB 2-0 SH 27XBRD (SUTURE) ×2 IMPLANT
SYR BULB EAR ULCER 3OZ GRN STR (SYRINGE) ×2 IMPLANT
TOWEL OR 17X24 6PK STRL BLUE (TOWEL DISPOSABLE) ×2 IMPLANT
TRAY FOLEY W/BAG SLVR 14FR LF (SET/KITS/TRAYS/PACK) ×2 IMPLANT

## 2023-05-13 NOTE — Op Note (Signed)
Operative Note  Preoperative Diagnosis: stress urinary incontinence  Postoperative Diagnosis: same  Procedures performed:  Midurethral sling (KIM sling), cystoscopy  Implants:  Implant Name Type Inv. Item Serial No. Manufacturer Lot No. LRB No. Used Action  Idalia Needle - [id number] Miscellaneous Idalia Needle [id number] NEOMEDIC  N/A 1 Implanted    Attending Surgeon: Lanetta Inch, MD  Anesthesia: General LMA  Findings: On cystoscopy, normal bladder and urethral mucosa without injury or lesion. Brisk bilateral ureteral efflux present.     Specimens: none  Estimated blood loss: 40 mL  IV fluids: 600 mL  Urine output: 100 mL  Complications: none  Procedure in Detail: After informed consent was obtained, the patient was taken to the operating room where she was placed under anesthesia.  She was then placed in the dorsal lithotomy position with allen stirrups,  and prepped and draped in the usual sterile fashion.  A strap was placed across her upper abdomen on top of her gown so it was not in direct contact with her skin.  Care was taken to avoid hyperflexion or hyperextension of her upper and lower extremities. A foley catheter was placed.  A lonestar self-retraining retractor was placed with 4 stay hooks. The mid urethral area was located on the anterior vaginal wall.  Two Allis clamps were placed at the level of the midurethra. 1% lidocaine with epinephrine was injected into the vaginal mucosa. A vertical incision was made between the two clamps using a 15-blade scalpel.  Using sharp dissection, Metzenbaum scissors were used to make a periurethral tunnel from the vaginal incision towards the pubic rami bilaterally for the future sling tracts. The bladder was ensured to be empty. The trocar and attached sling were introduced into the right side of the periurethral vaginal incision, just inferior to the pubic symphysis on the right side. The  trocar was guided through the endopelvic fascia and directly vertically.  While hugging the cephalad surface of the pubic bone, the trocar was guided out through the abdomen 2 fingerbreadths lateral to midline at the level of the pubic symphysis on the ipsilateral side. The trocar was placed on the left side in a similar fashion.  A 70-degree cystoscope was introduced, and 360-degree inspection revealed no trauma or trocars in the bladder, with brisk bilateral ureteral efflux.  The bladder was drained and the cystoscope was removed.  The Foley catheter was reinserted.  The sling was brought to lie beneath the mid-urethra. The sling was made to lie across the urethra with no tension.  The sling and the distal ends of the sling were trimmed just below the level of the skin incisions.  Tension-free positioning of the sling was confirmed. Vaginal inspection revealed no vaginotomy or sling perforations of the mucosa. Surgiflo was placed into the skin incision and pressure was held. Hemostasis was noted. The vaginal mucosal edges were reapproximated using 2-0 Vicryl.  The vagina was copiously irrigated.  Hemostasis was again noted. Vaginal packing not placed. The suprapubic sling incisions were closed with Dermabond. The patient tolerated the procedure well.  She was awakened from anesthesia and transferred to the recovery room in stable condition. All needle and sponge counts were correct x 2.     Marguerita Beards, MD

## 2023-05-13 NOTE — Anesthesia Preprocedure Evaluation (Signed)
Anesthesia Evaluation  Patient identified by MRN, date of birth, ID band Patient awake    Reviewed: Allergy & Precautions, NPO status , Patient's Chart, lab work & pertinent test results  Airway Mallampati: II  TM Distance: >3 FB Neck ROM: Full    Dental no notable dental hx.    Pulmonary asthma , sleep apnea    Pulmonary exam normal        Cardiovascular negative cardio ROS  Rhythm:Regular Rate:Normal     Neuro/Psych  Headaches  Anxiety Depression       GI/Hepatic Neg liver ROS,GERD  ,,  Endo/Other  Hypothyroidism    Renal/GU negative Renal ROS  negative genitourinary   Musculoskeletal  (+)  Fibromyalgia -, narcotic dependent  Abdominal Normal abdominal exam  (+)   Peds  Hematology Lab Results      Component                Value               Date                      WBC                      10.1                07/02/2022                HGB                      12.6                05/13/2023                HCT                      37.0                05/13/2023                MCV                      83.7                07/02/2022                PLT                      226                 07/02/2022              Anesthesia Other Findings   Reproductive/Obstetrics                             Anesthesia Physical Anesthesia Plan  ASA: 2  Anesthesia Plan: General   Post-op Pain Management: Gabapentin PO (pre-op)* and Tylenol PO (pre-op)*   Induction: Intravenous  PONV Risk Score and Plan: 3 and Ondansetron, Dexamethasone, Midazolam and Treatment may vary due to age or medical condition  Airway Management Planned: Mask and LMA  Additional Equipment: None  Intra-op Plan:   Post-operative Plan: Extubation in OR  Informed Consent: I have reviewed the patients History and Physical, chart, labs and discussed the procedure including the risks, benefits and alternatives for the  proposed anesthesia with the  patient or authorized representative who has indicated his/her understanding and acceptance.     Dental advisory given  Plan Discussed with: CRNA  Anesthesia Plan Comments:        Anesthesia Quick Evaluation

## 2023-05-13 NOTE — Transfer of Care (Signed)
Immediate Anesthesia Transfer of Care Note  Patient: Ashley Mathews  Procedure(s) Performed: Procedure(s) (LRB): TRANSVAGINAL TAPE (TVT) PROCEDURE (N/A) CYSTOSCOPY (N/A)  Patient Location: PACU  Anesthesia Type: GA  Level of Consciousness: awake, sedated, patient cooperative and responds to stimulation, c/o pain in back - comfort measures given w/ medication   Airway & Oxygen Therapy: Patient Spontanous Breathing and Patient connected to [place] oxygen  Post-op Assessment: Report given to PACU RN, Post -op Vital signs reviewed and stable and Patient moving all extremities  Post vital signs: Reviewed and stable  Complications: No apparent anesthesia complications

## 2023-05-13 NOTE — Interval H&P Note (Signed)
History and Physical Interval Note:  05/13/2023 1:00 PM  Ashley Mathews  has presented today for surgery, with the diagnosis of stress urinary incontinence.  The various methods of treatment have been discussed with the patient and family. After consideration of risks, benefits and other options for treatment, the patient has consented to  Procedure(s) with comments: TRANSVAGINAL TAPE (TVT) PROCEDURE (N/A)  CYSTOSCOPY (N/A) as a surgical intervention.  The patient's history has been reviewed, patient examined, no change in status, stable for surgery.  I have reviewed the patient's chart and labs.  Questions were answered to the patient's satisfaction.     Marguerita Beards

## 2023-05-13 NOTE — Discharge Instructions (Addendum)

## 2023-05-13 NOTE — Anesthesia Procedure Notes (Signed)
Procedure Name: LMA Insertion Date/Time: 05/13/2023 1:26 PM  Performed by: Jessica Priest, CRNAPre-anesthesia Checklist: Patient identified, Emergency Drugs available, Suction available, Patient being monitored and Timeout performed Patient Re-evaluated:Patient Re-evaluated prior to induction Oxygen Delivery Method: Circle system utilized Preoxygenation: Pre-oxygenation with 100% oxygen Induction Type: IV induction Ventilation: Mask ventilation without difficulty LMA: LMA inserted LMA Size: 4.0 Number of attempts: 1 Airway Equipment and Method: Bite block Placement Confirmation: positive ETCO2, breath sounds checked- equal and bilateral and CO2 detector Tube secured with: Tape Dental Injury: Teeth and Oropharynx as per pre-operative assessment

## 2023-05-13 NOTE — Progress Notes (Addendum)
Dr Florian Buff at bedside, notified for voiding trial results. 300 ml instilled, patient voided 400 ml, bladder scan for 30 ml.  No new orders at this time.

## 2023-05-14 ENCOUNTER — Telehealth: Payer: Self-pay | Admitting: Obstetrics and Gynecology

## 2023-05-14 NOTE — Anesthesia Postprocedure Evaluation (Signed)
Anesthesia Post Note  Patient: Ashley Mathews  Procedure(s) Performed: TRANSVAGINAL TAPE (TVT) PROCEDURE (Vagina ) CYSTOSCOPY (Bladder)     Patient location during evaluation: PACU Anesthesia Type: General Level of consciousness: awake and alert Pain management: pain level controlled Vital Signs Assessment: post-procedure vital signs reviewed and stable Respiratory status: spontaneous breathing, nonlabored ventilation, respiratory function stable and patient connected to nasal cannula oxygen Cardiovascular status: blood pressure returned to baseline and stable Postop Assessment: no apparent nausea or vomiting Anesthetic complications: no   No notable events documented.  Last Vitals:  Vitals:   05/13/23 1530 05/13/23 1651  BP: 117/64 136/66  Pulse: 71 75  Resp: 11 14  Temp:  (!) 36.2 C  SpO2: 98% 100%    Last Pain:  Vitals:   05/13/23 1651  TempSrc:   PainSc: 3                  Ernesto Zukowski P Zaylee Cornia

## 2023-05-14 NOTE — Telephone Encounter (Signed)
Ashley Mathews underwent Midurethral sling (KIM sling), cystoscopy on 05/13/23.   Ashley Mathews passed her voiding trial.  was backfilled into the bladder Voided  PVR by bladder scan was 0ml.   Ashley Mathews was discharged without a catheter. Please call her for a routine post op check. Thanks!  Marguerita Beards, MD

## 2023-05-14 NOTE — Telephone Encounter (Signed)
LM on the VM for the pt to call back

## 2023-05-15 ENCOUNTER — Encounter (HOSPITAL_BASED_OUTPATIENT_CLINIC_OR_DEPARTMENT_OTHER): Payer: Self-pay | Admitting: Obstetrics and Gynecology

## 2023-05-16 ENCOUNTER — Ambulatory Visit: Payer: 59 | Admitting: Orthopedic Surgery

## 2023-05-16 ENCOUNTER — Other Ambulatory Visit (INDEPENDENT_AMBULATORY_CARE_PROVIDER_SITE_OTHER): Payer: 59

## 2023-05-16 DIAGNOSIS — M25531 Pain in right wrist: Secondary | ICD-10-CM | POA: Diagnosis not present

## 2023-05-16 DIAGNOSIS — S62024A Nondisplaced fracture of middle third of navicular [scaphoid] bone of right wrist, initial encounter for closed fracture: Secondary | ICD-10-CM

## 2023-05-16 NOTE — Progress Notes (Signed)
Ashley Mathews - 63 y.o. female MRN [id number]  Date of birth: [birth year]  Office Visit Note: Visit Date: 05/16/2023 PCP: Merri Brunette, MD Referred by: Andi Devon, DO  Subjective: No chief complaint on file.  HPI: Ashley Mathews is a pleasant 63 y.o. female who presents today for for evaluation of a right wrist scaphoid fracture sustained approximately 1 month prior.  Injury mechanism described as a mechanical fall after missing a step at the bottom of the staircase, broke her fall with the right upper extremity.  She was seen initially, had x-rays which were negative, subsequent MRI did show nondisplaced scaphoid waist fracture.  She was subsequently given orthopedic hand follow-up.  Has not been immobilized.  Pertinent ROS were reviewed with the patient and found to be negative unless otherwise specified above in HPI.   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 Pain in right wrist     Plan: Extensive discussion was had with the patient today regarding her right wrist scaphoid fracture.  Repeat x-rays today including dedicated scaphoid view were obtained which do show nondisplaced scaphoid fracture, this correlates with her clinical examination with associated tenderness in this region and pain with wrist range of motion.  Discussed in detail the underlying nature of the scaphoid fracture as well as the poor blood supply and the possibility for delayed healing versus nonunion.  Emphasized the importance of immobilization in order to facilitate bony healing in this region.  Given the nondisplaced nature of this injury, we can proceed with nonoperative care.  Short arm  cast will be applied today to the right wrist for immobilization of the scaphoid fracture.  I will plan on seeing her back in approximately 1 month for repeat clinical and radiographic check.  Follow-up: No follow-ups on file.   Meds & Orders: No orders of the defined types were placed in this encounter.   Orders Placed This Encounter  Procedures   XR Wrist Complete Right     Procedures: No procedures performed      Clinical History: No specialty comments available.  She reports that she has never smoked. She has never used smokeless tobacco. No results for input(s): "HGBA1C", "LABURIC" in the last 8760 hours.  Objective:   Vital Signs: There were no vitals taken for this visit.  Physical Exam  Gen: Well-appearing, in no acute distress; non-toxic CV: Regular Rate. Well-perfused. Warm.  Resp: Breathing unlabored on room air; no wheezing. Psych: Fluid speech in conversation; appropriate affect; normal thought process  Ortho Exam Right wrist: - Notable tenderness over the scaphoid tubercle and anatomic snuffbox region - Wrist range of motion fairly well-preserved, flexion 65, extension 45 compared to contralateral side 80/65 - Full digital range of motion of the right hand, sensation is intact distally, hand remains warm well-perfused  Imaging: XR Wrist Complete Right Result Date: 05/16/2023 X-rays of the right wrist including dedicated scaphoid view were obtained today X-rays demonstrate stable appearance of the radiocarpal and midcarpal articulations.  Nondisplaced scaphoid fracture is appreciated without evidence of interval displacement or collapse.   Past Medical/Family/Surgical/Social History: Medications & Allergies reviewed per EMR, new medications updated. Patient Active Problem List   Diagnosis Date Noted   Closed fracture of scaphoid  bone of right wrist 05/10/2023   Heel spur, right 02/18/2023   History of colonic polyps 02/07/2023   Irritable bowel syndrome with  diarrhea 02/07/2023   Osteoporosis 06/01/2021   Hypersomnia 06/14/2017   Obstructive sleep apnea 06/14/2017   Knee pain, bilateral 12/22/2015   Palpitations 05/13/2013   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   Dyspnea 08/04/2012   Plantar fasciitis of right foot 02/20/2012   OTITIS MEDIA, CHRONIC 07/09/2008   GERD 07/09/2008   ALLERGIC RHINITIS 05/04/2008   ASTHMA 05/04/2008   LUPUS (SLE) 05/04/2008   FIBROMYALGIA 05/04/2008   HEADACHE, CHRONIC 05/04/2008   COUGH 05/04/2008   Past Medical History:  Diagnosis Date   Allergic rhinitis    Anxiety    occasionally   Asthma    as a child, only bothers her now after an URI   Chronic headache    Colon polyps    Complication of anesthesia    Patient states that sometimes she wakes up with uncontrollable crying after anesthesia. She also stated that  it took five attemps at a spinal for a c-section and that afterwards it took a long time to wear off.   Depression    Follows w/ PCP.   Fibromyalgia    Follows w/ PCP, Dr. Merri Brunette.   Gallstones    Headache, chronic daily    improved   History of kidney stones    HLD (hyperlipidemia)    HPV in female    Hx of ASCUS   Hypothyroidism    Follows w/ PCP.   IBS (irritable bowel syndrome)    Knee effusion, left 03/2023   From fall.   Lupus    Patient states that her doctors think she may have non-specific autoimmunity instead of Lupus? Follows w/ PCP.   Palpitations    Plantar fasciitis of right foot    Sleep apnea    uses dental appliance, follows w/ neurology   SUI (stress urinary incontinence, female)    Wears glasses    Wrist fracture 03/2023   right   Family History  Problem Relation Age of Onset   Heart disease Mother    Hypertension Mother    Stroke Mother    Allergic rhinitis Mother    Scleroderma Mother    Colon polyps Mother    Diabetes Mother    Irritable bowel syndrome Mother    Diverticulitis Mother    Sleep apnea Mother    Heart murmur Brother    Heart  attack Maternal Grandfather    Lung cancer Maternal Aunt    Heart disease Maternal Aunt    Lung cancer Maternal Uncle    Heart disease Maternal Uncle    Heart disease Maternal Uncle    Heart disease Maternal Uncle    Diabetes Maternal Uncle    Liver disease Neg Hx    Colon cancer Neg Hx    Esophageal cancer Neg Hx    Past Surgical History:  Procedure Laterality Date   bladder prolapse repair     BLADDER SUSPENSION N/A 05/13/2023   Procedure: TRANSVAGINAL TAPE (TVT) PROCEDURE;  Surgeon: Marguerita Beards, MD;  Location: [facility];  Service: Gynecology;  Laterality: N/A;   CERVICAL FUSION  11/2015   CESAREAN SECTION  1987   CHOLECYSTECTOMY  2011   COLONOSCOPY  03/13/2023   CYSTOSCOPY N/A 05/13/2023   Procedure: CYSTOSCOPY;  Surgeon: Marguerita Beards, MD;  Location: [facility];  Service: Gynecology;  Laterality: N/A;   LAPAROSCOPIC ENDOMETRIOSIS FULGURATION  MYRINGOTOMY     multiple   PARTIAL HYSTERECTOMY  1999   laparoscopic   Social History   Occupational History   Occupation: [employer]  Tobacco Use   Smoking status: Never   Smokeless tobacco: Never  Vaping Use   Vaping status: Never Used  Substance and Sexual Activity   Alcohol use: Yes    Comment: about 4 drinks of liquor per year   Drug use: No   Sexual activity: Yes    Starkeisha Vanwinkle Trevor Mace, M.D. [facility] 8:52 AM

## 2023-05-27 ENCOUNTER — Telehealth: Payer: Self-pay | Admitting: Orthopedic Surgery

## 2023-05-27 NOTE — Telephone Encounter (Signed)
 Pt was seen 1/30 with a cast put on. Dr Merlinda Starling wanted to see pt back in 1 month. Please open slot and call pt about this matter at [phone]

## 2023-05-29 NOTE — Telephone Encounter (Signed)
Patient returned call to get scheduled for follow up

## 2023-06-19 ENCOUNTER — Other Ambulatory Visit (INDEPENDENT_AMBULATORY_CARE_PROVIDER_SITE_OTHER)

## 2023-06-19 ENCOUNTER — Ambulatory Visit: Payer: 59 | Admitting: Orthopedic Surgery

## 2023-06-19 DIAGNOSIS — M25531 Pain in right wrist: Secondary | ICD-10-CM

## 2023-06-19 NOTE — Progress Notes (Signed)
 Ashley Mathews - 63 y.o. female MRN [id number]  Date of birth: 08-29-1960  Office Visit Note: Visit Date: 06/19/2023 PCP: Merri Brunette, MD Referred by: Merri Brunette, MD  Subjective: No chief complaint on file.  HPI: Ashley Mathews is a pleasant 63 y.o. female who presents today for for follow up of a right wrist scaphoid fracture being treated nonoperatively with casting.  She has been compliant with casting since her last visit approximately 1 month prior.  Does have some ongoing soreness at the wrist region.  Pertinent ROS were reviewed with the patient and found to be negative unless otherwise specified above in HPI.   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 Pain in right wrist     Plan: Extensive discussion was had with the patient today regarding her right wrist scaphoid fracture.  Repeat x-rays today including dedicated scaphoid view were obtained which do show nondisplaced scaphoid fracture, this correlates with her clinical examination previously, her tenderness has improved, however there is some pain that is persistent indicating ongoing healing.  I like to continue protecting her with a cast which was explained in detail today.  Discussed in detail the underlying nature of the scaphoid fracture as well as the poor blood supply and the possibility for delayed healing versus nonunion.  Emphasized the importance of immobilization in order to facilitate bony healing in this region.    Short arm cast will be applied today to the right wrist for immobilization of the scaphoid fracture.  I will plan on seeing her back in approximately 3 weeks for repeat clinical and  radiographic check.  At that time, should she demonstrate appropriate healing both clinically and radiographically, we will arrange her to be seen by occupational therapy for fabrication of an orthosis and try to begin some mobilization exercises.\  Follow-up: No follow-ups on file.   Meds & Orders: No orders of the defined types were placed in this encounter.   Orders Placed This Encounter  Procedures   XR Wrist Complete Right   Ambulatory referral to Occupational Therapy     Procedures: No procedures performed      Clinical History: No specialty comments available.  She reports that she has never smoked. She has never used smokeless tobacco. No results for input(s): "HGBA1C", "LABURIC" in the last 8760 hours.  Objective:   Vital Signs: There were no vitals taken for this visit.  Physical Exam  Gen: Well-appearing, in no acute distress; non-toxic CV: Regular Rate. Well-perfused. Warm.  Resp: Breathing unlabored on room air; no wheezing. Psych: Fluid speech in conversation; appropriate affect; normal thought process  Ortho Exam Right wrist: - Moderate tenderness over the scaphoid tubercle and anatomic snuffbox region - Wrist range of motion well-preserved, flexion 65, extension 45 compared to contralateral side 80/65 - Full digital range of motion of the right hand, sensation is intact distally, hand remains warm well-perfused  Imaging: XR Wrist Complete Right Result Date: 06/19/2023 X-rays of the right wrist including dedicated scaphoid view were performed today X-rays demonstrate known nondisplaced scaphoid fracture, no interval displacement in comparison to previous films.  Distal pole of the scaphoid does demonstrate some lucency.    Past Medical/Family/Surgical/Social History: Medications & Allergies reviewed per EMR,  new medications updated. Patient Active Problem List   Diagnosis Date Noted   Closed fracture of scaphoid bone of right wrist 05/10/2023   Heel spur,  right 02/18/2023   History of colonic polyps 02/07/2023   Irritable bowel syndrome with diarrhea 02/07/2023   Osteoporosis 06/01/2021   Hypersomnia 06/14/2017   Obstructive sleep apnea 06/14/2017   Knee pain, bilateral 12/22/2015   Palpitations 05/13/2013   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   Dyspnea 08/04/2012   Plantar fasciitis of right foot 02/20/2012   OTITIS MEDIA, CHRONIC 07/09/2008   GERD 07/09/2008   ALLERGIC RHINITIS 05/04/2008   ASTHMA 05/04/2008   LUPUS (SLE) 05/04/2008   FIBROMYALGIA 05/04/2008   HEADACHE, CHRONIC 05/04/2008   COUGH 05/04/2008   Past Medical History:  Diagnosis Date   Allergic rhinitis    Anxiety    occasionally   Asthma    as a child, only bothers her now after an URI   Chronic headache    Colon polyps    Complication of anesthesia    Patient states that sometimes she wakes up with uncontrollable crying after anesthesia. She also stated that  it took five attemps at a spinal for a c-section and that afterwards it took a long time to wear off.   Depression    Follows w/ PCP.   Fibromyalgia    Follows w/ PCP, Dr. Merri Brunette.   Gallstones    Headache, chronic daily    improved   History of kidney stones    HLD (hyperlipidemia)    HPV in female    Hx of ASCUS   Hypothyroidism    Follows w/ PCP.   IBS (irritable bowel syndrome)    Knee effusion, left 03/2023   From fall.   Lupus    Patient states that her doctors think she may have non-specific autoimmunity instead of Lupus? Follows w/ PCP.   Palpitations    Plantar fasciitis of right foot    Sleep apnea    uses dental appliance, follows w/ neurology   SUI (stress urinary incontinence, female)    Wears glasses    Wrist fracture 03/2023   right   Family History  Problem Relation Age of Onset   Heart disease Mother    Hypertension Mother    Stroke Mother    Allergic rhinitis Mother    Scleroderma Mother    Colon polyps Mother    Diabetes Mother    Irritable bowel syndrome  Mother    Diverticulitis Mother    Sleep apnea Mother    Heart murmur Brother    Heart attack Maternal Grandfather    Lung cancer Maternal Aunt    Heart disease Maternal Aunt    Lung cancer Maternal Uncle    Heart disease Maternal Uncle    Heart disease Maternal Uncle    Heart disease Maternal Uncle    Diabetes Maternal Uncle    Liver disease Neg Hx    Colon cancer Neg Hx    Esophageal cancer Neg Hx    Past Surgical History:  Procedure Laterality Date   bladder prolapse repair     BLADDER SUSPENSION N/A 05/13/2023   Procedure: TRANSVAGINAL TAPE (TVT) PROCEDURE;  Surgeon: Marguerita Beards, MD;  Location: [facility];  Service: Gynecology;  Laterality: N/A;   CERVICAL FUSION  11/2015   CESAREAN SECTION  1987   CHOLECYSTECTOMY  2011   COLONOSCOPY  03/13/2023   CYSTOSCOPY N/A 05/13/2023   Procedure: CYSTOSCOPY;  Surgeon: Marguerita Beards, MD;  Location: [facility];  Service: Gynecology;  Laterality: N/A;   LAPAROSCOPIC ENDOMETRIOSIS FULGURATION     MYRINGOTOMY     multiple   PARTIAL HYSTERECTOMY  1999   laparoscopic   Social History   Occupational History   Occupation: [employer]  Tobacco Use   Smoking status: Never   Smokeless tobacco: Never  Vaping Use   Vaping status: Never Used  Substance and Sexual Activity   Alcohol use: Yes    Comment: about 4 drinks of liquor per year   Drug use: No   Sexual activity: Yes    Anala Whisenant Trevor Mace, M.D. [facility]

## 2023-06-24 ENCOUNTER — Encounter: Payer: 59 | Admitting: Obstetrics and Gynecology

## 2023-06-26 ENCOUNTER — Ambulatory Visit (INDEPENDENT_AMBULATORY_CARE_PROVIDER_SITE_OTHER): Payer: 59 | Admitting: Obstetrics and Gynecology

## 2023-06-26 ENCOUNTER — Encounter: Payer: Self-pay | Admitting: Obstetrics and Gynecology

## 2023-06-26 VITALS — BP 124/82 | HR 80

## 2023-06-26 DIAGNOSIS — Z48816 Encounter for surgical aftercare following surgery on the genitourinary system: Secondary | ICD-10-CM

## 2023-06-26 DIAGNOSIS — Z9889 Other specified postprocedural states: Secondary | ICD-10-CM

## 2023-06-26 NOTE — Progress Notes (Signed)
 [facility]  Date of Visit: 06/26/2023  History of Present Illness: Ashley Mathews is a 63 y.o. female scheduled today for a post-operative visit.   Surgery: s/p Midurethral sling (KIM sling), cystoscopy on 05/13/23  She passed her postoperative void trial.   Postoperative course has been uncomplicated.   Recently had a cast placed for a hand fracture and this was difficult for her. She needs a cast for a few more weeks. This has been difficult for her.   UTI in the last 6 weeks? No  Pain? No  She has returned to her normal activity (except for postop restrictions) Vaginal bulge? No  Stress incontinence: No  Urgency/frequency: No - feels that her urgency has decreased Urge incontinence: No  Voiding dysfunction: No , emptying well Bowel issues: Took a full week to have a BM but normal now. Bowel leakage has improved. She has not been taking a fiber supplement.    Medications: She has a current medication list which includes the following prescription(s): alprazolam, nexletol, biotin, cetirizine, vitamin d3, cholestyramine, cimetidine, dicyclomine, diphenhydramine, imvexxy maintenance pack, famotidine, fluticasone, ibuprofen, imipramine, klor-con 10, levothyroxine, olopatadine hcl, polyethylene glycol powder, sertraline, and vitamin k, and the following Facility-Administered Medications: gadopentetate dimeglumine.   Allergies: Patient is allergic to clarithromycin, lidocaine, penicillins, sulfa antibiotics, sulfonamide derivatives, xylocaine  [lidocaine hcl], zithromax [azithromycin], cephalexin, ciprofloxacin, doxycycline, erythromycin, amoxicillin, amoxicillin-pot clavulanate, indomethacin, iodine, and zofran [ondansetron hcl].   Physical Exam: BP 124/82   Pulse 80    Suprapubic Incisions: healing well.  Pelvic Examination: Vagina: Incisions healing well. Sutures are present at incision line and there is not granulation tissue. No tenderness along the anterior or posterior  vagina. No apical tenderness. No pelvic masses. No visible or palpable mesh.  ---------------------------------------------------------  Assessment and Plan:  1. Post-operative state     - Well healed.  - Can resume regular activity including exercise and intercourse,  if desired.  - Briefly discussed treatment for bowel leakage if symptoms return.  Return as needed  Marguerita Beards, MD

## 2023-06-26 NOTE — Patient Instructions (Signed)
 Accidental Bowel Leakage: Our goal is to achieve formed bowel movements daily or every-other-day without leakage.  You may need to try different combinations of the following options to find what works best for you.  Some management options include: Dietary changes (more leafy greens, vegetables and fruits; less processed foods) Fiber supplementation (Metamucil or something with psyllium as active ingredient) Over-the-counter imodium (tablets or liquid) to help solidify the stool and prevent leakage of stool. If you get constipated you can use Miralax as needed to achieve bowel movements. Can also refer you to physical therapy if you would like

## 2023-06-28 ENCOUNTER — Encounter: Payer: Self-pay | Admitting: Obstetrics and Gynecology

## 2023-07-09 NOTE — Therapy (Signed)
 OUTPATIENT OCCUPATIONAL THERAPY ORTHO EVALUATION  Patient Name: Ashley Mathews MRN: [id number] DOB:03-Jun-1960, 63 y.o., female Today's Date: 07/10/2023  PCP: Franchot Mimes MD REFERRING PROVIDER:  Samuella Cota, MD    END OF SESSION:  OT End of Session - 07/10/23 0856     Visit Number 1    Number of Visits 10    Date for OT Re-Evaluation 08/23/23    Authorization Type [employer]    OT Start Time 0856    OT Stop Time 0935    OT Time Calculation (min) 39 min    Equipment Utilized During Treatment orthotic materials    Activity Tolerance Patient tolerated treatment well;Patient limited by pain;Patient limited by fatigue;No increased pain    Behavior During Therapy WFL for tasks assessed/performed             Past Medical History:  Diagnosis Date   Allergic rhinitis    Anxiety    occasionally   Asthma    as a child, only bothers her now after an URI   Chronic headache    Colon polyps    Complication of anesthesia    Patient states that sometimes she wakes up with uncontrollable crying after anesthesia. She also stated that  it took five attemps at a spinal for a c-section and that afterwards it took a long time to wear off.   Depression    Follows w/ PCP.   Fibromyalgia    Follows w/ PCP, Dr. Merri Brunette.   Gallstones    Headache, chronic daily    improved   History of kidney stones    HLD (hyperlipidemia)    HPV in female    Hx of ASCUS   Hypothyroidism    Follows w/ PCP.   IBS (irritable bowel syndrome)    Knee effusion, left 03/2023   From fall.   Lupus    Patient states that her doctors think she may have non-specific autoimmunity instead of Lupus? Follows w/ PCP.   Palpitations    Plantar fasciitis of right foot    Sleep apnea    uses dental appliance, follows w/ neurology   SUI (stress urinary incontinence, female)    Wears glasses    Wrist fracture 03/2023   right   Past Surgical History:  Procedure Laterality Date   bladder prolapse repair      BLADDER SUSPENSION N/A 05/13/2023   Procedure: TRANSVAGINAL TAPE (TVT) PROCEDURE;  Surgeon: Marguerita Beards, MD;  Location: [facility];  Service: Gynecology;  Laterality: N/A;   CERVICAL FUSION  11/2015   CESAREAN SECTION  1987   CHOLECYSTECTOMY  2011   COLONOSCOPY  03/13/2023   CYSTOSCOPY N/A 05/13/2023   Procedure: CYSTOSCOPY;  Surgeon: Marguerita Beards, MD;  Location: [facility];  Service: Gynecology;  Laterality: N/A;   LAPAROSCOPIC ENDOMETRIOSIS FULGURATION     MYRINGOTOMY     multiple   PARTIAL HYSTERECTOMY  1999   laparoscopic   Patient Active Problem List   Diagnosis Date Noted   Closed fracture of scaphoid bone of right wrist 05/10/2023   Heel spur, right 02/18/2023   History of colonic polyps 02/07/2023   Irritable bowel syndrome with diarrhea 02/07/2023   Osteoporosis 06/01/2021   Hypersomnia 06/14/2017   Obstructive sleep apnea 06/14/2017   Knee pain, bilateral 12/22/2015   Palpitations 05/13/2013   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   Dyspnea 08/04/2012   Plantar fasciitis of right foot 02/20/2012   OTITIS MEDIA, CHRONIC 07/09/2008   GERD  07/09/2008   ALLERGIC RHINITIS 05/04/2008   ASTHMA 05/04/2008   LUPUS (SLE) 05/04/2008   FIBROMYALGIA 05/04/2008   HEADACHE, CHRONIC 05/04/2008   COUGH 05/04/2008    ONSET DATE: 04/08/23 DOI  REFERRING DIAG: M25.531 (ICD-10-CM) - Pain in right wrist   THERAPY DIAG:  Localized edema  Muscle weakness (generalized)  Other lack of coordination  Pain in right wrist  Stiffness of right wrist, not elsewhere classified  Rationale for Evaluation and Treatment: Rehabilitation  SUBJECTIVE:   SUBJECTIVE STATEMENT: ~3 months s/p Rt scaphoid fx.  She states just had cast removed and is a bit sore, she fell in December.     PERTINENT HISTORY: Approximately 3 months status post right scaphoid fracture with conservative healing, well-healing per physician and fine to start active range  of motion but use caution with thumb motion  PRECAUTIONS: None  RED FLAGS: None   WEIGHT BEARING RESTRICTIONS: Yes no weightbearing more than 5 to 10 pounds or anything painful in the right wrist and hand now.  PAIN:  Are you having pain? Yes: NPRS scale: 4/10 at rest now Pain location: Right thumb and dorsal wrist Pain description: Aching Aggravating factors: Attempted weightbearing Relieving factors: Rest  FALLS: Has patient fallen in last 6 months? Yes. Number of falls 1 (this one, not a fall risk)   PLOF: Independent  PATIENT GOALS: To improve use of right dominant hand and arm for daily abilities  NEXT MD VISIT: 08/12/2023   OBJECTIVE: (All objective assessments below are from initial evaluation on: 07/10/23 unless otherwise specified.)   HAND DOMINANCE: Right   ADLs: Overall ADLs: States decreased ability to grab, hold household objects, pain and difficulty to open containers, perform FMS tasks (manipulate fasteners on clothing), mild to moderate bathing problems as well.    FUNCTIONAL OUTCOME MEASURES: Eval: Quick DASH 50% impairment today  (Higher % Score  =  More Impairment)     UPPER EXTREMITY ROM     Shoulder to Wrist AROM Right eval  Forearm supination 70  Forearm pronation  90  Wrist flexion 31  Wrist extension 44  Wrist ulnar deviation   Wrist radial deviation   Functional dart thrower's motion (F-DTM) in ulnar flexion   F-DTM in radial extension    (Blank rows = not tested)   Hand AROM Right eval  Full Fist Ability (or Gap to Distal Palmar Crease) full  Thumb Opposition  (Kapandji Scale)  6/10  Thumb MCP (0-60)   Thumb IP (0-80)   Thumb Radial Abduction Span   Thumb Palmar Abduction Span   Index MCP (0-90)   Index PIP (0-100)   Index DIP (0-70)    Long MCP (0-90)    Long PIP (0-100)    Long DIP (0-70)    Ring MCP (0-90)    Ring PIP (0-100)    Ring DIP (0-70)    Little MCP (0-90)    Little PIP (0-100)    Little DIP (0-70)    (Blank  rows = not tested)   UPPER EXTREMITY MMT:    Eval:  NT at eval due to recent and still healing injuries. Will be tested when appropriate.   MMT Right TBD  Elbow flexion   Elbow extension   Forearm supination   Forearm pronation   Wrist flexion   Wrist extension   Wrist ulnar deviation   Wrist radial deviation   (Blank rows = not tested)  HAND FUNCTION: Eval: Observed weakness in affected Rt hand.  Details will be  tested when safe and appropriate Grip strength Right: TBD lbs, Left: TBD lbs   COORDINATION: Eval: Observed coordination impairments with affected right hand.  Details will be tested when safe and appropriate 9 Hole Peg Test Right: TBD sec (TBD sec is WFL)   SENSATION: Eval:  Light touch intact today  EDEMA:   Eval:  Mildly swollen in right hand and wrist today, 16.4cm circumferentially around right distal wrist crease compared to 16.0cm in left distal wrist crease  COGNITION: Eval: Overall cognitive status: WFL for evaluation today   OBSERVATIONS:   Eval: Apparent stiffness, achiness, soreness as typical of this injury at this time.   TODAY'S TREATMENT:  Post-evaluation treatment:   She was given information and education on safety/self-care including no significant weightbearing that causes pain, likely between 5 and 10 pound limitation now.  She should avoid overworking or overusing her wrist and her thumb for the time being, and should rest in a custom orthosis when not doing exercises or hygiene.  Custom orthotic fabrication was indicated due to pt's fractured right scaphoid and need for safe, functional positioning. OT fabricated custom forearm-based thumb spica orthosis for pt today to immobilize the base of the thumb and wrist. It fit well with no areas of pressure, pt states a comfortable fit. Pt was educated on the wearing schedule (on at all times except for hygiene and exercises), to avoid exposing it to sources of heat, to wipe clean as needed (do not  wash, use harsh detergents), to call or come in ASAP if it is causing any irritation or is not achieving desired function. It will be checked/adjusted in upcoming sessions, as needed. Pt states understanding all directions.    She was also given the following home exercise program to perform as tolerated 4-6 times a day, without pain, to tolerable tension points.  Each was quickly demonstrated and explained with her demonstration back in statement of understanding.  These will likely need reviewed in upcoming sessions as time ran short at the end of the session.  She was also encouraged to do light functional activities less than 1 to 2 pounds several times a day as tolerated to maintain functional ability and coordination.  Exercises - Reach arms upward   - 4 x daily - 10 reps - Turn [employer] Facing Up & Down  - 4-6 x daily - 10-15 reps - Bend and Pull Back Wrist SLOWLY  - 4 x daily - 10-15 reps - "Windshield Wipers"   - 4 x daily - 10-15 reps - Wrist AROM Dart Throwers Motion  - 4 x daily - 10-15 reps - Tendon Glides  - 4-6 x daily - 3-5 reps - 2-3 seconds hold - Seated Thumb Circumduction AROM  - 4-6 x daily - 10-15 reps - Thumb Opposition  - 4-6 x daily - 10 reps    PATIENT EDUCATION: Education details: See tx section above for details  Person educated: Patient Education method: Verbal Instruction, Teach back, Handouts  Education comprehension: States and demonstrates understanding, Additional Education required    HOME EXERCISE PROGRAM: Access Code: [email or web address] URL: [email or web address] Date: 07/10/2023 Prepared by: Fannie Knee    GOALS: Goals reviewed with patient? Yes   SHORT TERM GOALS: (STG required if POC>30 days) Target Date: 07/26/2023  Pt will obtain protective, custom orthotic. Goal status: 07/10/2023: MET   2.  Pt will demo/state understanding of initial HEP to improve pain levels and prerequisite motion. Goal status: INITIAL   LONG TERM  GOALS: Target Date:  08/23/2023  Pt will improve functional ability by decreased impairment per Quick DASH assessment from 50% to 20% or better, for better quality of life. Goal status: INITIAL  2.  Pt will improve grip strength in right hand from unsafe to test to at least 35 lbs for functional use at home and in IADLs. Goal status: INITIAL  3.  Pt will improve A/ROM in right wrist flexion/extension from 31/44 degrees respectively to at least 60 degrees each, to have functional motion for tasks like reach and grasp.  Goal status: INITIAL  4.  Pt will improve strength in left wrist flexion/extension from apparent 3 -/5 MMT to at least 4+/5 MMT to have increased functional ability to carry out selfcare and higher-level homecare tasks with less difficulty. Goal status: INITIAL  5.  Pt will improve coordination skills in right hand and arm, as seen by within functional limit score on nine-hole peg testing to have increased functional ability to carry out fine motor tasks (fasteners, etc.) and more complex, coordinated IADLs (meal prep, sports, etc.).  Goal status: INITIAL  6.  Pt will decrease pain at rest from 4/10 to 1/10 or better to have better sleep and occupational participation in daily roles. Goal status: INITIAL   ASSESSMENT:  CLINICAL IMPRESSION: Patient is a 62y.o. female who was seen today for occupational therapy evaluation for right dominant wrist scaphoid fracture and subsequent stiffness, weakness, pain, decreased functional ability.  She will benefit from outpatient occupational therapy to decrease symptoms and increase quality of life.   PERFORMANCE DEFICITS: in functional skills including ADLs, IADLs, coordination, dexterity, proprioception, edema, ROM, strength, pain, fascial restrictions, flexibility, Fine motor control, Gross motor control, body mechanics, endurance, decreased knowledge of precautions, and UE functional use, cognitive skills including problem solving and  safety awareness, and psychosocial skills including coping strategies, environmental adaptation, and habits.   IMPAIRMENTS: are limiting patient from ADLs, IADLs, rest and sleep, work, and leisure.   COMORBIDITIES: may have co-morbidities  that affects occupational performance. Patient will benefit from skilled OT to address above impairments and improve overall function.  MODIFICATION OR ASSISTANCE TO COMPLETE EVALUATION: No modification of tasks or assist necessary to complete an evaluation.  OT OCCUPATIONAL PROFILE AND HISTORY: Problem focused assessment: Including review of records relating to presenting problem.  CLINICAL DECISION MAKING: Moderate - several treatment options, min-mod task modification necessary  REHAB POTENTIAL: Excellent  EVALUATION COMPLEXITY: Low      PLAN:  OT FREQUENCY: 1-2x/week  OT DURATION: 6 weeks through 08/23/2023 and up to 10 total visits as needed  PLANNED INTERVENTIONS: 97168 OT Re-evaluation, 97535 self care/ADL training, 01027 therapeutic exercise, 97530 therapeutic activity, 97112 neuromuscular re-education, 97140 manual therapy, 97035 ultrasound, 97039 fluidotherapy, 97010 moist heat, 97010 cryotherapy, 97760 Orthotics management and training, 25366 Subsequent splinting/medication, passive range of motion, compression bandaging, Dry needling, energy conservation, coping strategies training, and patient/family education  RECOMMENDED OTHER SERVICES: None now  CONSULTED AND AGREED WITH PLAN OF CARE: Patient  PLAN FOR NEXT SESSION:   Check orthosis, consider weaning to remove thumb blocking piece in 1 to 2 weeks as tolerated.  Check initial recommendations and home exercise program and upgrade to stretch program   Fannie Knee, OTR/L, CHT 07/10/2023, 1:28 PM

## 2023-07-10 ENCOUNTER — Ambulatory Visit (INDEPENDENT_AMBULATORY_CARE_PROVIDER_SITE_OTHER): Admitting: Orthopedic Surgery

## 2023-07-10 ENCOUNTER — Ambulatory Visit (INDEPENDENT_AMBULATORY_CARE_PROVIDER_SITE_OTHER): Admitting: Rehabilitative and Restorative Service Providers"

## 2023-07-10 ENCOUNTER — Encounter: Payer: Self-pay | Admitting: Rehabilitative and Restorative Service Providers"

## 2023-07-10 ENCOUNTER — Ambulatory Visit (INDEPENDENT_AMBULATORY_CARE_PROVIDER_SITE_OTHER)

## 2023-07-10 DIAGNOSIS — M6281 Muscle weakness (generalized): Secondary | ICD-10-CM

## 2023-07-10 DIAGNOSIS — R600 Localized edema: Secondary | ICD-10-CM

## 2023-07-10 DIAGNOSIS — R278 Other lack of coordination: Secondary | ICD-10-CM | POA: Diagnosis not present

## 2023-07-10 DIAGNOSIS — M25631 Stiffness of right wrist, not elsewhere classified: Secondary | ICD-10-CM

## 2023-07-10 DIAGNOSIS — M25531 Pain in right wrist: Secondary | ICD-10-CM

## 2023-07-10 DIAGNOSIS — S62024A Nondisplaced fracture of middle third of navicular [scaphoid] bone of right wrist, initial encounter for closed fracture: Secondary | ICD-10-CM

## 2023-07-10 NOTE — Progress Notes (Signed)
 Ashley Mathews - 63 y.o. female MRN [id number]  Date of birth: 1960/04/30  Office Visit Note: Visit Date: 07/10/2023 PCP: Merri Brunette, MD Referred by: Merri Brunette, MD  Subjective: No chief complaint on file.  HPI: Ashley Mathews is a pleasant 63 y.o. female who presents today for for follow up of a right wrist scaphoid fracture being treated nonoperatively with casting.  She has been compliant with casting since her last visit 3 weeks prior.  It has been roughly 3 months since her injury date at this point.  Her pain is improving significantly.  Pertinent ROS were reviewed with the patient and found to be negative unless otherwise specified above in HPI.     Assessment & Plan: Visit Diagnoses:  1. Closed nondisplaced fracture of middle third of scaphoid bone of right wrist, initial encounter   2. Pain in right wrist     Plan: Extensive discussion was had with the patient today regarding her right wrist scaphoid fracture.  Repeat x-rays today including dedicated scaphoid view were obtained which do show appropriate interval healing nondisplaced scaphoid fracture, this correlates with her clinical examination previously, her tenderness has improved significantly.  At this point we will begin range of motion exercise with occupational therapy and have fabricated orthosis made as well for ongoing protection for an additional 4 weeks.  Discussed in detail the underlying nature of the scaphoid fracture as well as the poor blood supply and the possibility for delayed healing versus nonunion.  Emphasized the importance of immobilization in order to facilitate bony healing in this region.  I will plan on seeing her back in approximate 4 weeks.   Follow-up: No follow-ups on file.   Meds & Orders: No orders of the defined types were placed in this encounter.   Orders Placed This Encounter  Procedures   XR Wrist Complete Right     Procedures: No procedures performed       Clinical History: No specialty comments available.  She reports that she has never smoked. She has never used smokeless tobacco. No results for input(s): "HGBA1C", "LABURIC" in the last 8760 hours.  Objective:   Vital Signs: There were no vitals taken for this visit.  Physical Exam  Gen: Well-appearing, in no acute distress; non-toxic CV: Regular Rate. Well-perfused. Warm.  Resp: Breathing unlabored on room air; no wheezing. Psych: Fluid speech in conversation; appropriate affect; normal thought process  Ortho Exam Right wrist: - Minimal tenderness over the scaphoid tubercle and anatomic snuffbox region - Wrist range of motion well-preserved, flexion 65, extension 45 compared to contralateral side 80/65 - Full digital range of motion of the right hand, sensation is intact distally, hand remains warm well-perfused  Imaging: XR Wrist Complete Right Result Date: 07/10/2023 X-rays of the right wrist show stable appearance of the previously known scaphoid fracture at the waist level, oblique view demonstrates fracture line at the volar cortex, nondisplaced in nature.  There does appear to be interval healing in comparison to the prior films, with less fracture gap and bony consolidation.     Past Medical/Family/Surgical/Social History: Medications & Allergies reviewed per EMR, new medications updated. Patient Active Problem List   Diagnosis Date Noted   Closed fracture of scaphoid bone of right wrist 05/10/2023   Heel spur, right 02/18/2023   History of colonic polyps 02/07/2023   Irritable bowel syndrome with diarrhea 02/07/2023   Osteoporosis 06/01/2021   Hypersomnia 06/14/2017   Obstructive sleep apnea 06/14/2017  Knee pain, bilateral 12/22/2015   Palpitations 05/13/2013   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   Dyspnea 08/04/2012   Plantar fasciitis of right foot 02/20/2012   OTITIS MEDIA, CHRONIC 07/09/2008   GERD 07/09/2008   ALLERGIC RHINITIS 05/04/2008   ASTHMA  05/04/2008   LUPUS (SLE) 05/04/2008   FIBROMYALGIA 05/04/2008   HEADACHE, CHRONIC 05/04/2008   COUGH 05/04/2008   Past Medical History:  Diagnosis Date   Allergic rhinitis    Anxiety    occasionally   Asthma    as a child, only bothers her now after an URI   Chronic headache    Colon polyps    Complication of anesthesia    Patient states that sometimes she wakes up with uncontrollable crying after anesthesia. She also stated that  it took five attemps at a spinal for a c-section and that afterwards it took a long time to wear off.   Depression    Follows w/ PCP.   Fibromyalgia    Follows w/ PCP, Dr. Merri Brunette.   Gallstones    Headache, chronic daily    improved   History of kidney stones    HLD (hyperlipidemia)    HPV in female    Hx of ASCUS   Hypothyroidism    Follows w/ PCP.   IBS (irritable bowel syndrome)    Knee effusion, left 03/2023   From fall.   Lupus    Patient states that her doctors think she may have non-specific autoimmunity instead of Lupus? Follows w/ PCP.   Palpitations    Plantar fasciitis of right foot    Sleep apnea    uses dental appliance, follows w/ neurology   SUI (stress urinary incontinence, female)    Wears glasses    Wrist fracture 03/2023   right   Family History  Problem Relation Age of Onset   Heart disease Mother    Hypertension Mother    Stroke Mother    Allergic rhinitis Mother    Scleroderma Mother    Colon polyps Mother    Diabetes Mother    Irritable bowel syndrome Mother    Diverticulitis Mother    Sleep apnea Mother    Heart murmur Brother    Heart attack Maternal Grandfather    Lung cancer Maternal Aunt    Heart disease Maternal Aunt    Lung cancer Maternal Uncle    Heart disease Maternal Uncle    Heart disease Maternal Uncle    Heart disease Maternal Uncle    Diabetes Maternal Uncle    Liver disease Neg Hx    Colon cancer Neg Hx    Esophageal cancer Neg Hx    Past Surgical History:  Procedure Laterality  Date   bladder prolapse repair     BLADDER SUSPENSION N/A 05/13/2023   Procedure: TRANSVAGINAL TAPE (TVT) PROCEDURE;  Surgeon: Marguerita Beards, MD;  Location: [facility];  Service: Gynecology;  Laterality: N/A;   CERVICAL FUSION  11/2015   CESAREAN SECTION  1987   CHOLECYSTECTOMY  2011   COLONOSCOPY  03/13/2023   CYSTOSCOPY N/A 05/13/2023   Procedure: CYSTOSCOPY;  Surgeon: Marguerita Beards, MD;  Location: [facility];  Service: Gynecology;  Laterality: N/A;   LAPAROSCOPIC ENDOMETRIOSIS FULGURATION     MYRINGOTOMY     multiple   PARTIAL HYSTERECTOMY  1999   laparoscopic   Social History   Occupational History   Occupation: [employer]  Tobacco Use   Smoking status: Never   Smokeless  tobacco: Never  Vaping Use   Vaping status: Never Used  Substance and Sexual Activity   Alcohol use: Yes    Comment: about 4 drinks of liquor per year   Drug use: No   Sexual activity: Yes    Janya Eveland Fara Boros) Denese Killings, M.D. [facility]

## 2023-07-16 ENCOUNTER — Emergency Department (HOSPITAL_COMMUNITY)

## 2023-07-16 ENCOUNTER — Encounter (HOSPITAL_COMMUNITY): Payer: Self-pay

## 2023-07-16 ENCOUNTER — Other Ambulatory Visit: Payer: Self-pay

## 2023-07-16 ENCOUNTER — Emergency Department (HOSPITAL_COMMUNITY)
Admission: EM | Admit: 2023-07-16 | Discharge: 2023-07-16 | Disposition: A | Discharge status: Home / Self Care | Attending: Emergency Medicine | Admitting: Emergency Medicine

## 2023-07-16 DIAGNOSIS — R1032 Left lower quadrant pain: Secondary | ICD-10-CM | POA: Diagnosis present

## 2023-07-16 DIAGNOSIS — N132 Hydronephrosis with renal and ureteral calculous obstruction: Secondary | ICD-10-CM | POA: Diagnosis not present

## 2023-07-16 DIAGNOSIS — N200 Calculus of kidney: Secondary | ICD-10-CM

## 2023-07-16 LAB — BASIC METABOLIC PANEL WITH GFR
Anion gap: 10 (ref 5–15)
BUN: 16 mg/dL (ref 8–23)
CO2: 21 mmol/L — ABNORMAL LOW (ref 22–32)
Calcium: 9 mg/dL (ref 8.9–10.3)
Chloride: 107 mmol/L (ref 98–111)
Creatinine, Ser: 0.89 mg/dL (ref 0.44–1.00)
GFR, Estimated: >60 mL/min (ref >60)
Glucose, Bld: 151 mg/dL — ABNORMAL HIGH (ref 70–99)
Potassium: 3.6 mmol/L (ref 3.5–5.1)
Sodium: 138 mmol/L (ref 135–145)

## 2023-07-16 LAB — CBC WITH DIFFERENTIAL/PLATELET
Abs Immature Granulocytes: 0.06 10*3/uL (ref 0.00–0.07)
Basophils Absolute: 0.1 10*3/uL (ref 0.0–0.1)
Basophils Relative: 1 %
Eosinophils Absolute: 0.2 10*3/uL (ref 0.0–0.5)
Eosinophils Relative: 2 %
HCT: 39.9 % (ref 36.0–46.0)
Hemoglobin: 13.1 g/dL (ref 12.0–15.0)
Immature Granulocytes: 1 %
Lymphocytes Relative: 24 %
Lymphs Abs: 2.2 10*3/uL (ref 0.7–4.0)
MCH: 28.9 pg (ref 26.0–34.0)
MCHC: 32.8 g/dL (ref 30.0–36.0)
MCV: 88.1 fL (ref 80.0–100.0)
Monocytes Absolute: 0.6 10*3/uL (ref 0.1–1.0)
Monocytes Relative: 6 %
Neutro Abs: 6.3 10*3/uL (ref 1.7–7.7)
Neutrophils Relative %: 66 %
Platelets: 260 10*3/uL (ref 150–400)
RBC: 4.53 MIL/uL (ref 3.87–5.11)
RDW: 11.8 % (ref 11.5–15.5)
WBC: 9.4 10*3/uL (ref 4.0–10.5)
nRBC: 0 % (ref 0.0–0.2)

## 2023-07-16 LAB — URINALYSIS, ROUTINE W REFLEX MICROSCOPIC
Bilirubin Urine: NEGATIVE
Glucose, UA: NEGATIVE mg/dL
Ketones, ur: NEGATIVE mg/dL
Leukocytes,Ua: NEGATIVE
Nitrite: NEGATIVE
Protein, ur: 30 mg/dL — AB
RBC / HPF: >50 RBC/hpf (ref 0–5)
Specific Gravity, Urine: 1.015 (ref 1.005–1.030)
WBC, UA: >50 WBC/hpf (ref 0–5)
pH: 6 (ref 5.0–8.0)

## 2023-07-16 MED ORDER — PROMETHAZINE HCL 25 MG PO TABS: 25.0000 mg | ORAL_TABLET | Freq: Four times a day (QID) | ORAL | 0 refills | Status: AC | PRN

## 2023-07-16 MED ORDER — OXYCODONE-ACETAMINOPHEN 5-325 MG PO TABS
2.0000 | ORAL_TABLET | Freq: Once | ORAL | Status: AC
  Administered 2023-07-16: 2 via ORAL
  Filled 2023-07-16: qty 2

## 2023-07-16 MED ORDER — HYDROMORPHONE HCL 1 MG/ML IJ SOLN
1.0000 mg | Freq: Once | INTRAMUSCULAR | Status: AC
  Administered 2023-07-16: 1 mg via INTRAVENOUS
  Filled 2023-07-16: qty 1

## 2023-07-16 MED ORDER — OXYCODONE-ACETAMINOPHEN 5-325 MG PO TABS: 1.0000 | ORAL_TABLET | Freq: Four times a day (QID) | ORAL | 0 refills | Status: AC | PRN

## 2023-07-16 MED ORDER — TAMSULOSIN HCL 0.4 MG PO CAPS: 0.4000 mg | ORAL_CAPSULE | Freq: Two times a day (BID) | ORAL | 0 refills | Status: AC

## 2023-07-16 MED ORDER — KETOROLAC TROMETHAMINE 30 MG/ML IJ SOLN
30.0000 mg | Freq: Once | INTRAMUSCULAR | Status: AC
  Administered 2023-07-16: 30 mg via INTRAVENOUS
  Filled 2023-07-16: qty 1

## 2023-07-16 NOTE — ED Provider Notes (Signed)
 Handoff from [employer], pending, reassessment after pain meds, and urinalysis results.  Patient has 4 to 5 cm, UVJ stone.  Physical Exam  BP (!) 155/64 (BP Location: Right Arm)   Pulse 70   Temp 97.8 F (36.6 C) (Oral)   Resp 18   Ht 5\' 4"  (1.626 m)   Wt 70.3 kg   SpO2 100%   BMI 26.61 kg/m   Physical Exam Vitals and nursing note reviewed.  Constitutional:      General: She is not in acute distress.    Appearance: She is well-developed.  HENT:     Head: Normocephalic and atraumatic.  Eyes:     Conjunctiva/sclera: Conjunctivae normal.  Cardiovascular:     Rate and Rhythm: Normal rate and regular rhythm.     Heart sounds: No murmur heard. Pulmonary:     Effort: Pulmonary effort is normal. No respiratory distress.     Breath sounds: Normal breath sounds.  Abdominal:     Palpations: Abdomen is soft.     Tenderness: There is no abdominal tenderness.  Musculoskeletal:        General: No swelling.     Cervical back: Neck supple.  Skin:    General: Skin is warm and dry.     Capillary Refill: Capillary refill takes less than 2 seconds.  Neurological:     Mental Status: She is alert.  Psychiatric:        Mood and Affect: Mood normal.     Procedures  Procedures  ED Course / MDM   Clinical Course as of 07/16/23 0633  Tue Jul 16, 2023  0600 CT Renal Stone Study Left sided stone present [RB]  0600 Basic metabolic panel(!) Normal creatinine, will give dose of toradol. [RB]  0600 CBC with Differential No leukocytosis or anemia. [RB]  0600 Urinalysis, Routine w reflex microscopic -Urine, Clean Catch Pending collection. [RB]    Clinical Course User Index [RB] Roxy Horseman, PA-C   Medical Decision Making Patient is an overall well-appearing 63 year old female, received handout, pending urinalysis collection.  Urine shows a large amount of RBCs, and white blood cells, I think this is likely secondary to the inflammatory nature, of the ureteral stone.  She has very  few bacteria, and no leuk asked, I think this is unlikely to be a urinary tract infection, especially since she has no infectious symptoms, and has no leukocytosis.  She is overall well-appearing, her pain is controlled after the Percocets.  We will discharge her home with Percocet, Phenergan, as well as Flomax, with follow-up with urology.  The stone is 4 to 5 mm, thus I think is likely to be able to be passed independently, however urology provided for follow-up  Amount and/or Complexity of Data Reviewed Labs: ordered. Decision-making details documented in ED Course. Radiology: ordered. Decision-making details documented in ED Course.  Risk Prescription drug management.        Pete Pelt, [place] 07/16/23 1610    Arby Barrette, MD 07/17/23 1626

## 2023-07-16 NOTE — ED Triage Notes (Signed)
 Pt arrived via [facility], from home. Pt c/o abd pain that started yesterday morning. Pt reported having blood in urine yesterday morning and went to urgent care for the hematuria. Pt woke up this am with severe abd pain with nausea and vomit and call EMS. On the way pt received 100 mg of fentanyl and 4 mg of zofran. BP 149/72; 99% RA; HR 80.

## 2023-07-16 NOTE — ED Provider Notes (Signed)
 [facility] Emergency Department Provider Note MRN:  [id number]  Arrival date & time: 07/16/23     Chief Complaint   No chief complaint on file.   History of Present Illness   Ashley Mathews is a 63 y.o. year-old female presents to the ED with chief complaint of left lower abdominal and flank pain.  Onset was yesterday morning.  Symptoms worsened today.  Was seen at urgent care yesterday for hematuria.  States that she has had 1 kidney stone in the past.  Rates her pain as severe.  Has had some associated nausea.  Was given 100 mcg of fentanyl by EMS with little improvement.  She denies any injury or trauma.  States that she is still having significant pain.  History provided by patient.   Review of Systems  Pertinent positive and negative review of systems noted in HPI.    Physical Exam   Vitals:   07/16/23 0445  BP: (!) 155/64  Pulse: 70  Resp: 18  Temp: 97.8 F (36.6 C)  SpO2: 100%    CONSTITUTIONAL:  uncomfortable-appearing, NAD NEURO:  Alert and oriented x 3, CN 3-12 grossly intact EYES:  eyes equal and reactive ENT/NECK:  Supple, no stridor  CARDIO:  normal rate, regular rhythm, appears well-perfused  PULM:  No respiratory distress, CTAB GI/GU:  non-distended,  MSK/SPINE:  No gross deformities, no edema, moves all extremities  SKIN:  no rash, atraumatic   *Additional and/or pertinent findings included in MDM below  Diagnostic and Interventional Summary    EKG Interpretation Date/Time:    Ventricular Rate:    PR Interval:    QRS Duration:    QT Interval:    QTC Calculation:   R Axis:      Text Interpretation:         Labs Reviewed  BASIC METABOLIC PANEL WITH GFR - Abnormal; Notable for the following components:      Result Value   CO2 21 (*)    Glucose, Bld 151 (*)    All other components within normal limits  CBC WITH DIFFERENTIAL/PLATELET  URINALYSIS, ROUTINE W REFLEX MICROSCOPIC    CT Renal Stone Study  Final  Result      Medications  HYDROmorphone (DILAUDID) injection 1 mg (1 mg Intravenous Given 07/16/23 0513)  ketorolac (TORADOL) 30 MG/ML injection 30 mg (30 mg Intravenous Given 07/16/23 0556)  oxyCODONE-acetaminophen (PERCOCET/ROXICET) 5-325 MG per tablet 2 tablet (2 tablets Oral Given 07/16/23 0608)     Procedures  /  Critical Care Procedures  ED Course and Medical Decision Making  I have reviewed the triage vital signs, the nursing notes, and pertinent available records from the EMR.  Social Determinants Affecting Complexity of Care: Patient has no clinically significant social determinants affecting this chief complaint..   ED Course: Clinical Course as of 07/16/23 0617  Tue Jul 16, 2023  0600 CT Renal Stone Study Left sided stone present [RB]  0600 Basic metabolic panel(!) Normal creatinine, will give dose of toradol. [RB]  0600 CBC with Differential No leukocytosis or anemia. [RB]  0600 Urinalysis, Routine w reflex microscopic -Urine, Clean Catch Pending collection. [RB]    Clinical Course User Index [RB] Roxy Horseman, PA-C    Medical Decision Making Amount and/or Complexity of Data Reviewed Labs: ordered. Decision-making details documented in ED Course. Radiology: ordered. Decision-making details documented in ED Course.  Risk Prescription drug management.         Consultants:    Treatment and Plan: Patient  signed out at shift change pending UA to rule out infection/septic stone and will need reassessment for pain control.    Final Clinical Impressions(s) / ED Diagnoses     ICD-10-CM   1. Kidney stone  N20.0       ED Discharge Orders          Ordered    oxyCODONE-acetaminophen (PERCOCET) 5-325 MG tablet  Every 6 hours PRN        07/16/23 0609    tamsulosin (FLOMAX) 0.4 MG CAPS capsule  2 times daily        07/16/23 0609              Discharge Instructions Discussed with and Provided to Patient:   Discharge Instructions   None       Roxy Horseman, PA-C 07/16/23 1610    Nira Conn, MD 07/16/23 [phone]

## 2023-07-16 NOTE — Discharge Instructions (Addendum)
 You have a kidney stone, your urine does not look infected, however if you have any fevers, chills, intractable nausea, vomiting please return to the ER.  Please take the pain medication as prescribed, and I have also sent you some nausea medicine to help with it.  Take the Flomax daily.  Follow-up with urology as instructed.

## 2023-07-17 NOTE — Therapy (Signed)
 OUTPATIENT OCCUPATIONAL THERAPY TREATMENT NOTE  Patient Name: Ashley Mathews MRN: [id number] DOB:[birth year], 63 y.o., female Today's Date: 07/18/2023  PCP: Franchot Mimes MD REFERRING PROVIDER:  Samuella Cota, MD    END OF SESSION:  OT End of Session - 07/18/23 0933     Visit Number 2    Number of Visits 10    Date for OT Re-Evaluation 08/23/23    Authorization Type [employer]    OT Start Time 0933    OT Stop Time 1005    OT Time Calculation (min) 32 min    Equipment Utilized During Treatment --    Activity Tolerance Patient tolerated treatment well;Patient limited by pain;Patient limited by fatigue;No increased pain    Behavior During Therapy WFL for tasks assessed/performed              Past Medical History:  Diagnosis Date   Allergic rhinitis    Anxiety    occasionally   Asthma    as a child, only bothers her now after an URI   Chronic headache    Colon polyps    Complication of anesthesia    Patient states that sometimes she wakes up with uncontrollable crying after anesthesia. She also stated that  it took five attemps at a spinal for a c-section and that afterwards it took a long time to wear off.   Depression    Follows w/ PCP.   Fibromyalgia    Follows w/ PCP, Dr. Merri Brunette.   Gallstones    Headache, chronic daily    improved   History of kidney stones    HLD (hyperlipidemia)    HPV in female    Hx of ASCUS   Hypothyroidism    Follows w/ PCP.   IBS (irritable bowel syndrome)    Knee effusion, left 03/2023   From fall.   Lupus    Patient states that her doctors think she may have non-specific autoimmunity instead of Lupus? Follows w/ PCP.   Palpitations    Plantar fasciitis of right foot    Sleep apnea    uses dental appliance, follows w/ neurology   SUI (stress urinary incontinence, female)    Wears glasses    Wrist fracture 03/2023   right   Past Surgical History:  Procedure Laterality Date   bladder prolapse repair     BLADDER SUSPENSION  N/A 05/13/2023   Procedure: TRANSVAGINAL TAPE (TVT) PROCEDURE;  Surgeon: Marguerita Beards, MD;  Location: [facility];  Service: Gynecology;  Laterality: N/A;   CERVICAL FUSION  11/2015   CESAREAN SECTION  1987   CHOLECYSTECTOMY  2011   COLONOSCOPY  03/13/2023   CYSTOSCOPY N/A 05/13/2023   Procedure: CYSTOSCOPY;  Surgeon: Marguerita Beards, MD;  Location: [facility];  Service: Gynecology;  Laterality: N/A;   LAPAROSCOPIC ENDOMETRIOSIS FULGURATION     MYRINGOTOMY     multiple   PARTIAL HYSTERECTOMY  1999   laparoscopic   Patient Active Problem List   Diagnosis Date Noted   Closed fracture of scaphoid bone of right wrist 05/10/2023   Heel spur, right 02/18/2023   History of colonic polyps 02/07/2023   Irritable bowel syndrome with diarrhea 02/07/2023   Osteoporosis 06/01/2021   Hypersomnia 06/14/2017   Obstructive sleep apnea 06/14/2017   Knee pain, bilateral 12/22/2015   Palpitations 05/13/2013   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   Dyspnea 08/04/2012   Plantar fasciitis of right foot 02/20/2012   OTITIS MEDIA, CHRONIC 07/09/2008   GERD  07/09/2008   ALLERGIC RHINITIS 05/04/2008   ASTHMA 05/04/2008   LUPUS (SLE) 05/04/2008   FIBROMYALGIA 05/04/2008   HEADACHE, CHRONIC 05/04/2008   COUGH 05/04/2008    ONSET DATE: 04/08/23 DOI  REFERRING DIAG: M25.531 (ICD-10-CM) - Pain in right wrist   THERAPY DIAG:  Localized edema  Muscle weakness (generalized)  Other lack of coordination  Pain in right wrist  Stiffness of right wrist, not elsewhere classified  Rationale for Evaluation and Treatment: Rehabilitation  PERTINENT HISTORY: Approximately 3 months status post right scaphoid fracture with conservative healing, well-healing per physician and fine to start active range of motion but use caution with thumb motion  PRECAUTIONS: None  RED FLAGS: None   WEIGHT BEARING RESTRICTIONS: Yes no weightbearing more than 5 to 10 pounds or  anything painful in the right wrist and hand now.   SUBJECTIVE:   SUBJECTIVE STATEMENT: 3 months s/p Rt scaphoid fx.  She states doing "terrible" due to passing     PAIN:  Are you having pain? Yes: NPRS scale: 0-1/10 at rest now Pain location: Right thumb and dorsal wrist Pain description: Aching Aggravating factors: Attempted weightbearing Relieving factors: Rest   PATIENT GOALS: To improve use of right dominant hand and arm for daily abilities  NEXT MD VISIT: 08/12/2023   OBJECTIVE: (All objective assessments below are from initial evaluation on: 07/10/23 unless otherwise specified.)   HAND DOMINANCE: Right   ADLs: Overall ADLs: States decreased ability to grab, hold household objects, pain and difficulty to open containers, perform FMS tasks (manipulate fasteners on clothing), mild to moderate bathing problems as well.    FUNCTIONAL OUTCOME MEASURES: Eval: Quick DASH 50% impairment today  (Higher % Score  =  More Impairment)     UPPER EXTREMITY ROM     Shoulder to Wrist AROM Right eval Rt 07/18/23  Forearm supination 70 76  Forearm pronation  90 90  Wrist flexion 31 45  Wrist extension 44 57  Wrist ulnar deviation    Wrist radial deviation    Functional dart thrower's motion (F-DTM) in ulnar flexion    F-DTM in radial extension     (Blank rows = not tested)   Hand AROM Right eval  Full Fist Ability (or Gap to Distal Palmar Crease) full  Thumb Opposition  (Kapandji Scale)  6/10  Thumb MCP (0-60)   Thumb IP (0-80)   Thumb Radial Abduction Span   Thumb Palmar Abduction Span   Index MCP (0-90)   Index PIP (0-100)   Index DIP (0-70)    Long MCP (0-90)    Long PIP (0-100)    Long DIP (0-70)    Ring MCP (0-90)    Ring PIP (0-100)    Ring DIP (0-70)    Little MCP (0-90)    Little PIP (0-100)    Little DIP (0-70)    (Blank rows = not tested)   UPPER EXTREMITY MMT:    Eval:  NT at eval due to recent and still healing injuries. Will be tested when  appropriate.   MMT Right TBD  Elbow flexion   Elbow extension   Forearm supination   Forearm pronation   Wrist flexion   Wrist extension   Wrist ulnar deviation   Wrist radial deviation   (Blank rows = not tested)  HAND FUNCTION: Eval: Observed weakness in affected Rt hand.  Details will be tested when safe and appropriate Grip strength Right: TBD lbs, Left: TBD lbs   COORDINATION: Eval: Observed coordination impairments  with affected right hand.  Details will be tested when safe and appropriate 9 Hole Peg Test Right: TBD sec (TBD sec is [facility])   EDEMA:   Eval:  Mildly swollen in right hand and wrist today, 16.4cm circumferentially around right distal wrist crease compared to 16.0cm in left distal wrist crease  OBSERVATIONS:   Eval: Apparent stiffness, achiness, soreness as typical of this injury at this time.   TODAY'S TREATMENT:    07/18/23: She starts with active range of motion for exercises as well as new measures which show significant improvements in all planes of motion at the wrist and thumb.  OT reviews her home exercise program with therapy and updates to add the bolded stretches below.  She was shown multiple ways to do these and cautioned to make them nonpainful.  OT then does IASTM manual therapy around the thenar eminence and base of the thumb gently which she states is pain relieving and helps her relax and feel better.  At the end of the session thumb opposition improves to 8 out of 10 on Kapandji scale.  And she leaves with no pain     Exercises - Turn Palm Facing Up & Down  - 4-6 x daily - 10-15 reps - Wrist Flexion Stretch  - 4 x daily - 3-5 reps - 15 sec hold - Seated Wrist Extension Stretch  - 3-6 x daily - 3-5 reps - 15 hold - Bend and Pull Back Wrist SLOWLY  - 4 x daily - 6 reps - "Windshield Wipers"   - 4 x daily - 10-15 reps - Wrist AROM Dart Throwers Motion  - 4 x daily - 10-15 reps - Tendon Glides  - 4-6 x daily - 3-5 reps - 2-3 seconds hold - Seated  Composite Thumb Flexion PROM  - 4 x daily - 3-5 reps - 15 sec hold - Thumb Opposition  - 4-6 x daily - 10 reps    PATIENT EDUCATION: Education details: See tx section above for details  Person educated: Patient Education method: Verbal Instruction, Teach back, Handouts  Education comprehension: States and demonstrates understanding, Additional Education required    HOME EXERCISE PROGRAM: Access Code: [email or web address] URL: [email or web address] Date: 07/10/2023 Prepared by: Fannie Knee    GOALS: Goals reviewed with patient? Yes   SHORT TERM GOALS: (STG required if POC>30 days) Target Date: 07/26/2023  Pt will obtain protective, custom orthotic. Goal status: 07/10/2023: MET   2.  Pt will demo/state understanding of initial HEP to improve pain levels and prerequisite motion. Goal status: 07/18/23: MET   LONG TERM GOALS: Target Date: 08/23/2023  Pt will improve functional ability by decreased impairment per Quick DASH assessment from 50% to 20% or better, for better quality of life. Goal status: INITIAL  2.  Pt will improve grip strength in right hand from unsafe to test to at least 35 lbs for functional use at home and in IADLs. Goal status: INITIAL  3.  Pt will improve A/ROM in right wrist flexion/extension from 31/44 degrees respectively to at least 60 degrees each, to have functional motion for tasks like reach and grasp.  Goal status: INITIAL  4.  Pt will improve strength in left wrist flexion/extension from apparent 3 -/5 MMT to at least 4+/5 MMT to have increased functional ability to carry out selfcare and higher-level homecare tasks with less difficulty. Goal status: INITIAL  5.  Pt will improve coordination skills in right hand and arm, as seen by within functional limit score on  nine-hole peg testing to have increased functional ability to carry out fine motor tasks (fasteners, etc.) and more complex, coordinated IADLs (meal prep, sports, etc.).  Goal  status: INITIAL  6.  Pt will decrease pain at rest from 4/10 to 1/10 or better to have better sleep and occupational participation in daily roles. Goal status: INITIAL   ASSESSMENT:  CLINICAL IMPRESSION: 07/18/23: Unfortunately she was passing a kidney stone and having some pain, but still was able to improve her range of motion.  She was still a bit tender so we withhold removing the thumb blocking piece for another week or 2.  Eval: Patient is a 62y.o. female who was seen today for occupational therapy evaluation for right dominant wrist scaphoid fracture and subsequent stiffness, weakness, pain, decreased functional ability.  She will benefit from outpatient occupational therapy to decrease symptoms and increase quality of life.      PLAN:  OT FREQUENCY: 1-2x/week  OT DURATION: 6 weeks through 08/23/2023 and up to 10 total visits as needed  PLANNED INTERVENTIONS: 97168 OT Re-evaluation, 97535 self care/ADL training, 16109 therapeutic exercise, 97530 therapeutic activity, 97112 neuromuscular re-education, 97140 manual therapy, 97035 ultrasound, 97039 fluidotherapy, 97010 moist heat, 97010 cryotherapy, 97760 Orthotics management and training, 60454 Subsequent splinting/medication, passive range of motion, compression bandaging, Dry needling, energy conservation, coping strategies training, and patient/family education  CONSULTED AND AGREED WITH PLAN OF CARE: Patient  PLAN FOR NEXT SESSION:   Consider making new wrist immobilization brace that does not block thumb if it is no longer very tender.  Check on new stretches and consider adding light pinch and grip training as tolerated.   Fannie Knee, OTR/L, CHT 07/18/2023, 10:06 AM

## 2023-07-18 ENCOUNTER — Ambulatory Visit: Admitting: Rehabilitative and Restorative Service Providers"

## 2023-07-18 ENCOUNTER — Encounter: Payer: Self-pay | Admitting: Rehabilitative and Restorative Service Providers"

## 2023-07-18 DIAGNOSIS — M25531 Pain in right wrist: Secondary | ICD-10-CM

## 2023-07-18 DIAGNOSIS — R600 Localized edema: Secondary | ICD-10-CM

## 2023-07-18 DIAGNOSIS — M25631 Stiffness of right wrist, not elsewhere classified: Secondary | ICD-10-CM

## 2023-07-18 DIAGNOSIS — R278 Other lack of coordination: Secondary | ICD-10-CM | POA: Diagnosis not present

## 2023-07-18 DIAGNOSIS — M6281 Muscle weakness (generalized): Secondary | ICD-10-CM

## 2023-07-24 ENCOUNTER — Encounter: Payer: Self-pay | Admitting: Rehabilitative and Restorative Service Providers"

## 2023-07-24 ENCOUNTER — Ambulatory Visit: Admitting: Rehabilitative and Restorative Service Providers"

## 2023-07-24 DIAGNOSIS — M25531 Pain in right wrist: Secondary | ICD-10-CM

## 2023-07-24 DIAGNOSIS — R600 Localized edema: Secondary | ICD-10-CM | POA: Diagnosis not present

## 2023-07-24 DIAGNOSIS — M6281 Muscle weakness (generalized): Secondary | ICD-10-CM

## 2023-07-24 DIAGNOSIS — R278 Other lack of coordination: Secondary | ICD-10-CM

## 2023-07-24 DIAGNOSIS — M25631 Stiffness of right wrist, not elsewhere classified: Secondary | ICD-10-CM

## 2023-07-24 NOTE — Therapy (Signed)
 OUTPATIENT OCCUPATIONAL THERAPY TREATMENT NOTE  Patient Name: Ashley Mathews MRN: [id number] DOB:Sep 29, 1960, 63 y.o., female Today's Date: 07/24/2023  PCP: Franchot Mimes MD REFERRING PROVIDER:  Samuella Cota, MD    END OF SESSION:  OT End of Session - 07/24/23 0931     Visit Number 3    Number of Visits 10    Date for OT Re-Evaluation 08/23/23    Authorization Type [employer]    OT Start Time 0931    OT Stop Time 1016    OT Time Calculation (min) 45 min    Equipment Utilized During Treatment orthotic materials    Activity Tolerance Patient tolerated treatment well;Patient limited by fatigue;No increased pain    Behavior During Therapy WFL for tasks assessed/performed             Past Medical History:  Diagnosis Date   Allergic rhinitis    Anxiety    occasionally   Asthma    as a child, only bothers her now after an URI   Chronic headache    Colon polyps    Complication of anesthesia    Patient states that sometimes she wakes up with uncontrollable crying after anesthesia. She also stated that  it took five attemps at a spinal for a c-section and that afterwards it took a long time to wear off.   Depression    Follows w/ PCP.   Fibromyalgia    Follows w/ PCP, Dr. Merri Brunette.   Gallstones    Headache, chronic daily    improved   History of kidney stones    HLD (hyperlipidemia)    HPV in female    Hx of ASCUS   Hypothyroidism    Follows w/ PCP.   IBS (irritable bowel syndrome)    Knee effusion, left 03/2023   From fall.   Lupus    Patient states that her doctors think she may have non-specific autoimmunity instead of Lupus? Follows w/ PCP.   Palpitations    Plantar fasciitis of right foot    Sleep apnea    uses dental appliance, follows w/ neurology   SUI (stress urinary incontinence, female)    Wears glasses    Wrist fracture 03/2023   right   Past Surgical History:  Procedure Laterality Date   bladder prolapse repair     BLADDER SUSPENSION N/A  05/13/2023   Procedure: TRANSVAGINAL TAPE (TVT) PROCEDURE;  Surgeon: Marguerita Beards, MD;  Location: [facility];  Service: Gynecology;  Laterality: N/A;   CERVICAL FUSION  11/2015   CESAREAN SECTION  1987   CHOLECYSTECTOMY  2011   COLONOSCOPY  03/13/2023   CYSTOSCOPY N/A 05/13/2023   Procedure: CYSTOSCOPY;  Surgeon: Marguerita Beards, MD;  Location: [facility];  Service: Gynecology;  Laterality: N/A;   LAPAROSCOPIC ENDOMETRIOSIS FULGURATION     MYRINGOTOMY     multiple   PARTIAL HYSTERECTOMY  1999   laparoscopic   Patient Active Problem List   Diagnosis Date Noted   Closed fracture of scaphoid bone of right wrist 05/10/2023   Heel spur, right 02/18/2023   History of colonic polyps 02/07/2023   Irritable bowel syndrome with diarrhea 02/07/2023   Osteoporosis 06/01/2021   Hypersomnia 06/14/2017   Obstructive sleep apnea 06/14/2017   Knee pain, bilateral 12/22/2015   Palpitations 05/13/2013   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   Dyspnea 08/04/2012   Plantar fasciitis of right foot 02/20/2012   OTITIS MEDIA, CHRONIC 07/09/2008   GERD 07/09/2008  ALLERGIC RHINITIS 05/04/2008   ASTHMA 05/04/2008   LUPUS (SLE) 05/04/2008   FIBROMYALGIA 05/04/2008   HEADACHE, CHRONIC 05/04/2008   COUGH 05/04/2008    ONSET DATE: 04/08/23 DOI  REFERRING DIAG: M25.531 (ICD-10-CM) - Pain in right wrist   THERAPY DIAG:  Localized edema  Muscle weakness (generalized)  Other lack of coordination  Pain in right wrist  Stiffness of right wrist, not elsewhere classified  Rationale for Evaluation and Treatment: Rehabilitation  PERTINENT HISTORY: Approximately 3 months status post right scaphoid fracture with conservative healing, well-healing per physician and fine to start active range of motion but use caution with thumb motion  PRECAUTIONS: None  RED FLAGS: None   WEIGHT BEARING RESTRICTIONS: Yes no weightbearing more than 5 to 10 pounds or  anything painful in the right wrist and hand now.   SUBJECTIVE:   SUBJECTIVE STATEMENT: 3 months s/p Rt scaphoid fx.  She states occasional "nerve-like" shooting pain from thumb to elbow & fatigue.    PAIN:  Are you having pain? Yes: NPRS scale:  0/10 at rest now Pain location: Right thumb and dorsal wrist Pain description: Aching Aggravating factors: Attempted weightbearing Relieving factors: Rest   PATIENT GOALS: To improve use of right dominant hand and arm for daily abilities  NEXT MD VISIT: 08/12/2023   OBJECTIVE: (All objective assessments below are from initial evaluation on: 07/10/23 unless otherwise specified.)   HAND DOMINANCE: Right   ADLs: Overall ADLs: States decreased ability to grab, hold household objects, pain and difficulty to open containers, perform FMS tasks (manipulate fasteners on clothing), mild to moderate bathing problems as well.    FUNCTIONAL OUTCOME MEASURES: Eval: Quick DASH 50% impairment today  (Higher % Score  =  More Impairment)     UPPER EXTREMITY ROM     Shoulder to Wrist AROM Right eval Rt 07/18/23 Rt 07/24/23  Forearm supination 70 76 76  Forearm pronation  90 90 90  Wrist flexion 31 45 54  Wrist extension 44 57 50  Wrist ulnar deviation   19  Wrist radial deviation   14  Functional dart thrower's motion (F-DTM) in ulnar flexion   53  F-DTM in radial extension    63  (Blank rows = not tested)   Hand AROM Right eval  Full Fist Ability (or Gap to Distal Palmar Crease) full  Thumb Opposition  (Kapandji Scale)  6/10  Thumb MCP (0-60)   Thumb IP (0-80)   Thumb Radial Abduction Span   Thumb Palmar Abduction Span   Index MCP (0-90)   Index PIP (0-100)   Index DIP (0-70)    Long MCP (0-90)    Long PIP (0-100)    Long DIP (0-70)    Ring MCP (0-90)    Ring PIP (0-100)    Ring DIP (0-70)    Little MCP (0-90)    Little PIP (0-100)    Little DIP (0-70)    (Blank rows = not tested)   UPPER EXTREMITY MMT:    Eval:  NT at  eval due to recent and still healing injuries. Will be tested when appropriate.   MMT Right TBD  Elbow flexion   Elbow extension   Forearm supination   Forearm pronation   Wrist flexion   Wrist extension   Wrist ulnar deviation   Wrist radial deviation   (Blank rows = not tested)  HAND FUNCTION: 07/24/23: Grip: Rt : 22#,  Lt: 37#    Eval: Observed weakness in affected Rt hand.  Details will be tested when safe and appropriate Grip strength Right: TBD lbs, Left: TBD lbs   COORDINATION: Eval: Observed coordination impairments with affected right hand.  Details will be tested when safe and appropriate 9 Hole Peg Test Right: TBD sec (TBD sec is [facility])   EDEMA:   Eval:  Mildly swollen in right hand and wrist today, 16.4cm circumferentially around right distal wrist crease compared to 16.0cm in left distal wrist crease  OBSERVATIONS:   Eval: Apparent stiffness, achiness, soreness as typical of this injury at this time.   TODAY'S TREATMENT:    07/24/23: She starts with AROM for exercise and new measures showing improved AROM now, less pain around thumb, etc.  Opposition now 9/10.  OT upgrades her orthosis to not include thumb now, but she was edu for safety to put thumb spica back on if she has any pain and does not tolerate new orthosis. Additionally stretches were reviewed, new stretches educated on, and new light hand strength with red t-putty.  Tolerates this well w/o pain and states understanding to also wean for light activities as tolerated, maybe only 10-15 ins at a time, several times a day unless painful.     Custom orthotic fabrication was indicated due to pt's healing scaphoid fx and need for safe, functional positioning. OT fabricated custom wrist immobilization orthosis with thumb free now for pt today to allow thumb AROM, to prevent stiffness/pain, but still protect scaphoid. It fit well with no areas of pressure, pt states a comfortable fit. Pt was educated on the wearing  schedule (on at most times except for hygiene and exercises, and careful weaning), to avoid exposing it to sources of heat, to wipe clean as needed (do not wash, use harsh detergents), to call or come in ASAP if it is causing any irritation or is not achieving desired function. It will be checked/adjusted in upcoming sessions, as needed. Pt states understanding all directions.     Exercises - Forearm Supination Stretch  - 3-4 x daily - 3 reps - 15 sec hold - Wrist Flexion Stretch  - 4 x daily - 3-5 reps - 15 sec hold - Wrist Prayer Stretch  - 4 x daily - 3-5 reps - 15 sec hold - Seated Wrist Ulnar Deviation Stretch  - 3-5 x daily - 3 reps - 15 sec hold - Seated Wrist Radial Deviation Stretch  - 3-5 x daily - 3 reps - 15 hold - Wrist AROM Dart Throwers Motion  - 4 x daily - 10-15 reps - Seated Composite Thumb Flexion PROM  - 4 x daily - 3-5 reps - 15 sec hold - Full Fist  - 2-3 x daily - 5 reps - 10sec hold     PATIENT EDUCATION: Education details: See tx section above for details  Person educated: Patient Education method: Verbal Instruction, Teach back, Handouts  Education comprehension: States and demonstrates understanding, Additional Education required    HOME EXERCISE PROGRAM: Access Code: [email or web address] URL: [email or web address] Date: 07/10/2023 Prepared by: Fannie Knee    GOALS: Goals reviewed with patient? Yes   SHORT TERM GOALS: (STG required if POC>30 days) Target Date: 07/26/2023  Pt will obtain protective, custom orthotic. Goal status: 07/10/2023: MET   2.  Pt will demo/state understanding of initial HEP to improve pain levels and prerequisite motion. Goal status: 07/18/23: MET   LONG TERM GOALS: Target Date: 08/23/2023  Pt will improve functional ability by decreased impairment per Quick DASH assessment from 50% to 20% or better,  for better quality of life. Goal status: INITIAL  2.  Pt will improve grip strength in right hand from unsafe to test  to at least 35 lbs for functional use at home and in IADLs. Goal status: INITIAL  3.  Pt will improve A/ROM in right wrist flexion/extension from 31/44 degrees respectively to at least 60 degrees each, to have functional motion for tasks like reach and grasp.  Goal status: INITIAL  4.  Pt will improve strength in left wrist flexion/extension from apparent 3 -/5 MMT to at least 4+/5 MMT to have increased functional ability to carry out selfcare and higher-level homecare tasks with less difficulty. Goal status: INITIAL  5.  Pt will improve coordination skills in right hand and arm, as seen by within functional limit score on nine-hole peg testing to have increased functional ability to carry out fine motor tasks (fasteners, etc.) and more complex, coordinated IADLs (meal prep, sports, etc.).  Goal status: INITIAL  6.  Pt will decrease pain at rest from 4/10 to 1/10 or better to have better sleep and occupational participation in daily roles. Goal status: INITIAL   ASSESSMENT:  CLINICAL IMPRESSION: 07/24/23: She is definitely improving as she tolerates thumb motion well, as well as light gripping now.  Will attempt weaning and light strengthening progressively now     PLAN:  OT FREQUENCY: 1-2x/week  OT DURATION: 6 weeks through 08/23/2023 and up to 10 total visits as needed  PLANNED INTERVENTIONS: 97168 OT Re-evaluation, 97535 self care/ADL training, 29562 therapeutic exercise, 97530 therapeutic activity, 97112 neuromuscular re-education, 97140 manual therapy, 97035 ultrasound, 97039 fluidotherapy, 97010 moist heat, 97010 cryotherapy, 97760 Orthotics management and training, 13086 Subsequent splinting/medication, passive range of motion, compression bandaging, Dry needling, energy conservation, coping strategies training, and patient/family education  CONSULTED AND AGREED WITH PLAN OF CARE: Patient  PLAN FOR NEXT SESSION:   Check orthosis, continue weaning,  upgrade strengthening    Fannie Knee, OTR/L, CHT 07/24/2023, 10:31 AM

## 2023-07-30 NOTE — Therapy (Signed)
 OUTPATIENT OCCUPATIONAL THERAPY TREATMENT NOTE  Patient Name: Ashley Mathews MRN: [id number] DOB:[birth year], 63 y.o., female Today's Date: 07/31/2023  PCP: Franchot Mimes MD REFERRING PROVIDER:  Samuella Cota, MD    END OF SESSION:  OT End of Session - 07/31/23 0933     Visit Number 4    Number of Visits 10    Date for OT Re-Evaluation 08/23/23    Authorization Type U[employer]    OT Start Time 0933    OT Stop Time 1011    OT Time Calculation (min) 38 min    Equipment Utilized During Treatment --    Activity Tolerance Patient tolerated treatment well;Patient limited by fatigue;No increased pain    Behavior During Therapy WFL for tasks assessed/performed              Past Medical History:  Diagnosis Date   Allergic rhinitis    Anxiety    occasionally   Asthma    as a child, only bothers her now after an URI   Chronic headache    Colon polyps    Complication of anesthesia    Patient states that sometimes she wakes up with uncontrollable crying after anesthesia. She also stated that  it took five attemps at a spinal for a c-section and that afterwards it took a long time to wear off.   Depression    Follows w/ PCP.   Fibromyalgia    Follows w/ PCP, Dr. Merri Brunette.   Gallstones    Headache, chronic daily    improved   History of kidney stones    HLD (hyperlipidemia)    HPV in female    Hx of ASCUS   Hypothyroidism    Follows w/ PCP.   IBS (irritable bowel syndrome)    Knee effusion, left 03/2023   From fall.   Lupus    Patient states that her doctors think she may have non-specific autoimmunity instead of Lupus? Follows w/ PCP.   Palpitations    Plantar fasciitis of right foot    Sleep apnea    uses dental appliance, follows w/ neurology   SUI (stress urinary incontinence, female)    Wears glasses    Wrist fracture 03/2023   right   Past Surgical History:  Procedure Laterality Date   bladder prolapse repair     BLADDER SUSPENSION N/A 05/13/2023    Procedure: TRANSVAGINAL TAPE (TVT) PROCEDURE;  Surgeon: Marguerita Beards, MD;  Location: [facility];  Service: Gynecology;  Laterality: N/A;   CERVICAL FUSION  11/2015   CESAREAN SECTION  1987   CHOLECYSTECTOMY  2011   COLONOSCOPY  03/13/2023   CYSTOSCOPY N/A 05/13/2023   Procedure: CYSTOSCOPY;  Surgeon: Marguerita Beards, MD;  Location: [facility];  Service: Gynecology;  Laterality: N/A;   LAPAROSCOPIC ENDOMETRIOSIS FULGURATION     MYRINGOTOMY     multiple   PARTIAL HYSTERECTOMY  1999   laparoscopic   Patient Active Problem List   Diagnosis Date Noted   Closed fracture of scaphoid bone of right wrist 05/10/2023   Heel spur, right 02/18/2023   History of colonic polyps 02/07/2023   Irritable bowel syndrome with diarrhea 02/07/2023   Osteoporosis 06/01/2021   Hypersomnia 06/14/2017   Obstructive sleep apnea 06/14/2017   Knee pain, bilateral 12/22/2015   Palpitations 05/13/2013   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   Dyspnea 08/04/2012   Plantar fasciitis of right foot 02/20/2012   OTITIS MEDIA, CHRONIC 07/09/2008   GERD 07/09/2008  ALLERGIC RHINITIS 05/04/2008   ASTHMA 05/04/2008   LUPUS (SLE) 05/04/2008   FIBROMYALGIA 05/04/2008   HEADACHE, CHRONIC 05/04/2008   COUGH 05/04/2008    ONSET DATE: 04/08/23 DOI  REFERRING DIAG: M25.531 (ICD-10-CM) - Pain in right wrist   THERAPY DIAG:  Muscle weakness (generalized)  Other lack of coordination  Stiffness of right wrist, not elsewhere classified  Pain in right wrist  Localized edema  Rationale for Evaluation and Treatment: Rehabilitation  PERTINENT HISTORY: Approximately 3 months status post right scaphoid fracture with conservative healing, well-healing per physician and fine to start active range of motion but use caution with thumb motion  PRECAUTIONS: None  RED FLAGS: None   WEIGHT BEARING RESTRICTIONS: Yes no weightbearing more than 5 to 10 pounds or anything painful in  the right wrist and hand now.   SUBJECTIVE:   SUBJECTIVE STATEMENT: 3+ months s/p Rt scaphoid fx.  She states falling asleep without brace the other night, and now it's painful, and she feels "set-back."     PAIN:  Are you having pain? Yes: NPRS scale: 3-4/10 at rest now Pain location: Right thumb and dorsal wrist Pain description: Aching Aggravating factors: Attempted weightbearing Relieving factors: Rest   PATIENT GOALS: To improve use of right dominant hand and arm for daily abilities  NEXT MD VISIT: 08/12/2023   OBJECTIVE: (All objective assessments below are from initial evaluation on: 07/10/23 unless otherwise specified.)   HAND DOMINANCE: Right   ADLs: Overall ADLs: States decreased ability to grab, hold household objects, pain and difficulty to open containers, perform FMS tasks (manipulate fasteners on clothing), mild to moderate bathing problems as well.    FUNCTIONAL OUTCOME MEASURES: Eval: Quick DASH 50% impairment today  (Higher % Score  =  More Impairment)     UPPER EXTREMITY ROM     Shoulder to Wrist AROM Right eval Rt 07/18/23 Rt 07/24/23 Rt 07/31/23  Forearm supination 70 76 76 77  Forearm pronation  90 90 90 90  Wrist flexion 31 45 54 56  Wrist extension 44 57 50 64  Wrist ulnar deviation   19 26  Wrist radial deviation   14 24  Functional dart thrower's motion (F-DTM) in ulnar flexion   53   F-DTM in radial extension    63   (Blank rows = not tested)   Hand AROM Right eval Rt 07/31/23  Full Fist Ability (or Gap to Distal Palmar Crease) full full  Thumb Opposition  (Kapandji Scale)  6/10 9/10  Thumb MCP (0-60)    Thumb IP (0-80)    Thumb Radial Abduction Span    Thumb Palmar Abduction Span    (Blank rows = not tested)   UPPER EXTREMITY MMT:    Eval:  NT at eval due to recent and still healing injuries. Will be tested when appropriate.   MMT Right TBD  Elbow flexion   Elbow extension   Forearm supination   Forearm pronation   Wrist  flexion   Wrist extension   Wrist ulnar deviation   Wrist radial deviation   (Blank rows = not tested)  HAND FUNCTION: 07/31/23:  Rt: 25#   07/24/23: Grip: Rt : 22#,  Lt: 37#     COORDINATION: Eval: Observed coordination impairments with affected right hand.  Details will be tested when safe and appropriate   EDEMA:   Eval:  Mildly swollen in right hand and wrist today, 16.4cm circumferentially around right distal wrist crease compared to 16.0cm in left distal wrist crease  OBSERVATIONS:   07/31/23: OT gives her a comprehensive check today due to recent exacerbation.  Negative scaphoid shift test, slight swelling at the dorsum of the scaphoid, no significant tenderness or bruising, distal radial ulnar joint is somewhat unstable in supination but not pronation.  Thumb compression is not painful to scaphoid, scaphoid is somewhat tender in the snuffbox. OT feels that she just strained or sprained this area somewhat and we will compensate for this.   Eval: Apparent stiffness, achiness, soreness as typical of this injury at this time.   TODAY'S TREATMENT:    07/31/23: To help compensate for her recent exacerbation of pain at the scaphoid, she was told to transition back to the thumb spica orthosis for at least 3 to 5 days or until her pain calms down.  Then she can return back to the wrist immobilization brace.  She should withhold weaning from the brace for light activities for at least 3 to 5 days.  She should withhold therapy putty if painful, however it has not been.  She should continue her stretch program.  She was also educated on new exercises today including median nerve flossing for her continued complaint of "carpal tunnel" like symptoms.  As well as light isometric wrist strengthening done very carefully and lightly today.  She actually tolerates all these things well today, after OT does IASTM to light myofascial release around the forearm area and thumb.  At the end of the session  she states understanding all directions and leaves feeling less pain now.    Exercises - Forearm Supination Stretch  - 3-4 x daily - 3 reps - 15 sec hold - Wrist Flexion Stretch  - 4 x daily - 3-5 reps - 15 sec hold - Wrist Prayer Stretch  - 4 x daily - 3-5 reps - 15 sec hold - Seated Wrist Ulnar Deviation Stretch  - 3-5 x daily - 3 reps - 15 sec hold - Seated Wrist Radial Deviation Stretch  - 3-5 x daily - 3 reps - 15 hold - Wrist AROM Dart Throwers Motion  - 4 x daily - 10-15 reps - Seated Composite Thumb Flexion PROM  - 4 x daily - 3-5 reps - 15 sec hold - Full Fist  - 2-3 x daily - 5 reps - 10sec hold - Seated Isometric Wrist Radial Deviation with Manual Resistance  - 2-3 x daily - 4-5 x weekly - 5 reps - 10 sec hold - Seated Isometric Wrist Ulnar Deviation with Manual Resistance  - 2-3 x daily - 4-5 x weekly - 5 reps - 5-10 hold - Wrist Flexion Isometric With Forearm Pronated  - 4-6 x daily - 1 sets - 10-15 reps - Isometric Wrist Extension Pronated  - 2-3 x daily - 4-5 x weekly - 5 reps - 5-10 seconds hold - Median Nerve Flossing  - 2-3 x daily - 5-10 reps    PATIENT EDUCATION: Education details: See tx section above for details  Person educated: Patient Education method: Engineer, structural, Teach back, Handouts  Education comprehension: States and demonstrates understanding, Additional Education required    HOME EXERCISE PROGRAM: Access Code: [email or web address] URL: [email or web address] Date: 07/10/2023 Prepared by: Leartis Proud    GOALS: Goals reviewed with patient? Yes   SHORT TERM GOALS: (STG required if POC>30 days) Target Date: 07/26/2023  Pt will obtain protective, custom orthotic. Goal status: 07/10/2023: MET   2.  Pt will demo/state understanding of initial HEP to improve pain levels and prerequisite motion. Goal status:  07/18/23: MET   LONG TERM GOALS: Target Date: 08/23/2023  Pt will improve functional ability by decreased impairment per Quick  DASH assessment from 50% to 20% or better, for better quality of life. Goal status: INITIAL  2.  Pt will improve grip strength in right hand from unsafe to test to at least 35 lbs for functional use at home and in IADLs. Goal status: INITIAL  3.  Pt will improve A/ROM in right wrist flexion/extension from 31/44 degrees respectively to at least 60 degrees each, to have functional motion for tasks like reach and grasp.  Goal status: INITIAL  4.  Pt will improve strength in left wrist flexion/extension from apparent 3 -/5 MMT to at least 4+/5 MMT to have increased functional ability to carry out selfcare and higher-level homecare tasks with less difficulty. Goal status: INITIAL  5.  Pt will improve coordination skills in right hand and arm, as seen by within functional limit score on nine-hole peg testing to have increased functional ability to carry out fine motor tasks (fasteners, etc.) and more complex, coordinated IADLs (meal prep, sports, etc.).  Goal status: INITIAL  6.  Pt will decrease pain at rest from 4/10 to 1/10 or better to have better sleep and occupational participation in daily roles. Goal status: INITIAL   ASSESSMENT:  CLINICAL IMPRESSION: 07/31/23: She had a slight exacerbation, but OT is confident that this is just a strain/sprain and she should take it easy for a few days to a week.  We discussed this and how to advance as tolerated.  Carry on  07/24/23: She is definitely improving as she tolerates thumb motion well, as well as light gripping now.  Will attempt weaning and light strengthening progressively now     PLAN:  OT FREQUENCY: 1-2x/week  OT DURATION: 6 weeks through 08/23/2023 and up to 10 total visits as needed  PLANNED INTERVENTIONS: 97168 OT Re-evaluation, 97535 self care/ADL training, 16109 therapeutic exercise, 97530 therapeutic activity, 97112 neuromuscular re-education, 97140 manual therapy, 97035 ultrasound, 97039 fluidotherapy, 97010 moist heat, 97010  cryotherapy, 97760 Orthotics management and training, 60454 Subsequent splinting/medication, passive range of motion, compression bandaging, Dry needling, energy conservation, coping strategies training, and patient/family education  CONSULTED AND AGREED WITH PLAN OF CARE: Patient  PLAN FOR NEXT SESSION:   Check on exacerbation, see if we're back on track  [employer], OTR/L, CHT 07/31/2023, 10:16 AM

## 2023-07-31 ENCOUNTER — Ambulatory Visit: Admitting: Rehabilitative and Restorative Service Providers"

## 2023-07-31 ENCOUNTER — Encounter: Payer: Self-pay | Admitting: Rehabilitative and Restorative Service Providers"

## 2023-07-31 DIAGNOSIS — R278 Other lack of coordination: Secondary | ICD-10-CM

## 2023-07-31 DIAGNOSIS — M25631 Stiffness of right wrist, not elsewhere classified: Secondary | ICD-10-CM

## 2023-07-31 DIAGNOSIS — M6281 Muscle weakness (generalized): Secondary | ICD-10-CM

## 2023-07-31 DIAGNOSIS — M25531 Pain in right wrist: Secondary | ICD-10-CM

## 2023-07-31 DIAGNOSIS — R600 Localized edema: Secondary | ICD-10-CM

## 2023-08-06 NOTE — Therapy (Signed)
 OUTPATIENT OCCUPATIONAL THERAPY TREATMENT NOTE  Patient Name: Ashley Mathews MRN: [id number] DOB:[birth year], 63 y.o., female Today's Date: 08/07/2023  PCP: Lillette Reid MD REFERRING PROVIDER:  Merrill Abide, MD    END OF SESSION:  OT End of Session - 08/07/23 0931     Visit Number 5    Number of Visits 10    Date for OT Re-Evaluation 08/23/23    Authorization Type [employer]    OT Start Time 0932    OT Stop Time 1011    OT Time Calculation (min) 39 min    Equipment Utilized During Treatment Compressive wrist wrap and red therapy band    Activity Tolerance Patient tolerated treatment well;Patient limited by fatigue;No increased pain    Behavior During Therapy WFL for tasks assessed/performed               Past Medical History:  Diagnosis Date   Allergic rhinitis    Anxiety    occasionally   Asthma    as a child, only bothers her now after an URI   Chronic headache    Colon polyps    Complication of anesthesia    Patient states that sometimes she wakes up with uncontrollable crying after anesthesia. She also stated that  it took five attemps at a spinal for a c-section and that afterwards it took a long time to wear off.   Depression    Follows w/ PCP.   Fibromyalgia    Follows w/ PCP, Dr. Imelda Man.   Gallstones    Headache, chronic daily    improved   History of kidney stones    HLD (hyperlipidemia)    HPV in female    Hx of ASCUS   Hypothyroidism    Follows w/ PCP.   IBS (irritable bowel syndrome)    Knee effusion, left 03/2023   From fall.   Lupus    Patient states that her doctors think she may have non-specific autoimmunity instead of Lupus? Follows w/ PCP.   Palpitations    Plantar fasciitis of right foot    Sleep apnea    uses dental appliance, follows w/ neurology   SUI (stress urinary incontinence, female)    Wears glasses    Wrist fracture 03/2023   right   Past Surgical History:  Procedure Laterality Date   bladder prolapse repair      BLADDER SUSPENSION N/A 05/13/2023   Procedure: TRANSVAGINAL TAPE (TVT) PROCEDURE;  Surgeon: Arma Lamp, MD;  Location: [facility];  Service: Gynecology;  Laterality: N/A;   CERVICAL FUSION  11/2015   CESAREAN SECTION  1987   CHOLECYSTECTOMY  2011   COLONOSCOPY  03/13/2023   CYSTOSCOPY N/A 05/13/2023   Procedure: CYSTOSCOPY;  Surgeon: Arma Lamp, MD;  Location: [facility];  Service: Gynecology;  Laterality: N/A;   LAPAROSCOPIC ENDOMETRIOSIS FULGURATION     MYRINGOTOMY     multiple   PARTIAL HYSTERECTOMY  1999   laparoscopic   Patient Active Problem List   Diagnosis Date Noted   Closed fracture of scaphoid bone of right wrist 05/10/2023   Heel spur, right 02/18/2023   History of colonic polyps 02/07/2023   Irritable bowel syndrome with diarrhea 02/07/2023   Osteoporosis 06/01/2021   Hypersomnia 06/14/2017   Obstructive sleep apnea 06/14/2017   Knee pain, bilateral 12/22/2015   Palpitations 05/13/2013   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   Dyspnea 08/04/2012   Plantar fasciitis of right foot 02/20/2012   OTITIS MEDIA, CHRONIC  07/09/2008   GERD 07/09/2008   ALLERGIC RHINITIS 05/04/2008   ASTHMA 05/04/2008   LUPUS (SLE) 05/04/2008   FIBROMYALGIA 05/04/2008   HEADACHE, CHRONIC 05/04/2008   COUGH 05/04/2008    ONSET DATE: 04/08/23 DOI  REFERRING DIAG: M25.531 (ICD-10-CM) - Pain in right wrist   THERAPY DIAG:  Stiffness of right wrist, not elsewhere classified  Muscle weakness (generalized)  Other lack of coordination  Pain in right wrist  Localized edema  Rationale for Evaluation and Treatment: Rehabilitation  PERTINENT HISTORY: Approximately 3 months status post right scaphoid fracture with conservative healing, well-healing per physician and fine to start active range of motion but use caution with thumb motion  PRECAUTIONS: None  RED FLAGS: None   WEIGHT BEARING RESTRICTIONS: Yes no weightbearing more than 5  to 10 pounds or anything painful in the right wrist and hand now.   SUBJECTIVE:   SUBJECTIVE STATEMENT: 3+ months s/p Rt scaphoid fx.  She states bumping her wrist at the grocery store without her brace, and having a flair up at her triquetrum.       PAIN:  Are you having pain? Yes: NPRS scale: 1/10 at rest now; up to 3-4/10 with motion at triquetrum  Pain location: Right thumb and dorsal wrist Pain description: Aching Aggravating factors: Attempted weightbearing Relieving factors: Rest   PATIENT GOALS: To improve use of right dominant hand and arm for daily abilities  NEXT MD VISIT: 08/12/2023   OBJECTIVE: (All objective assessments below are from initial evaluation on: 07/10/23 unless otherwise specified.)   HAND DOMINANCE: Right   ADLs: Overall ADLs: States decreased ability to grab, hold household objects, pain and difficulty to open containers, perform FMS tasks (manipulate fasteners on clothing), mild to moderate bathing problems as well.    FUNCTIONAL OUTCOME MEASURES: Eval: Quick DASH 50% impairment today  (Higher % Score  =  More Impairment)     UPPER EXTREMITY ROM     Shoulder to Wrist AROM Right eval Rt 07/18/23 Rt 07/24/23 Rt 07/31/23 Rt 08/07/23  Forearm supination 70 76 76 77 75  Forearm pronation  90 90 90 90 90  Wrist flexion 31 45 54 56 54  Wrist extension 44 57 50 64 66  Wrist ulnar deviation   19 26   Wrist radial deviation   14 24   Functional dart thrower's motion (F-DTM) in ulnar flexion   53    F-DTM in radial extension    63    (Blank rows = not tested)   Hand AROM Right eval Rt 07/31/23  Full Fist Ability (or Gap to Distal Palmar Crease) full full  Thumb Opposition  (Kapandji Scale)  6/10 9/10  Thumb MCP (0-60)    Thumb IP (0-80)    Thumb Radial Abduction Span    Thumb Palmar Abduction Span    (Blank rows = not tested)   UPPER EXTREMITY MMT:    Eval:  NT at eval due to recent and still healing injuries. Will be tested when  appropriate.   MMT Right 08/07/23  Elbow flexion   Elbow extension   Forearm supination 4-/5 tender, nerves  Forearm pronation 4+/5  Wrist flexion 4+/5  Wrist extension 4+/5  Wrist ulnar deviation 4+/5  Wrist radial deviation 4+/5  (Blank rows = not tested)  HAND FUNCTION: 07/31/23:  Rt: 25#   07/24/23: Grip: Rt : 22#,  Lt: 37#     COORDINATION: Eval: Observed coordination impairments with affected right hand.  Details will be tested when safe  and appropriate   EDEMA:   Eval:  Mildly swollen in right hand and wrist today, 16.4cm circumferentially around right distal wrist crease compared to 16.0cm in left distal wrist crease  OBSERVATIONS:   07/31/23: OT gives her a comprehensive check today due to recent exacerbation.  Negative scaphoid shift test, slight swelling at the dorsum of the scaphoid, no significant tenderness or bruising, distal radial ulnar joint is somewhat unstable in supination but not pronation.  Thumb compression is not painful to scaphoid, scaphoid is somewhat tender in the snuffbox. OT feels that she just strained or sprained this area somewhat and we will compensate for this.   Eval: Apparent stiffness, achiness, soreness as typical of this injury at this time.   TODAY'S TREATMENT:    08/07/23: She starts with active range of motion for new measures which shows some continued stiffness but no significant problems after recent bump at the grocery store.  She was given a compressive wrist wrap to help her wean out of her rigid orthosis, and we review her home exercises, adding a thumb stretch to help manage apparent osteoarthritis, education to avoid lateral pinching and prevent thumb zigzag deformity.  We were also able to upgrade her strengthening to isokinetic strengthening with therapy bands and a hammer for more dynamic strengthening now.  She tolerates these well, and is told to do these things cautiously to avoid exacerbation.  She should still not bear heavy  weight(more than 10 or 15 pounds) through the right arm still.  She leaves in no significant pain and states understanding    Exercises - Forearm Supination Stretch  - 3-4 x daily - 3 reps - 15 sec hold - Wrist Flexion Stretch  - 4 x daily - 5 reps - 15 sec hold - Wrist Prayer Stretch  - 4 x daily - 3 reps - 15 sec hold - Seated Wrist Ulnar Deviation Stretch  - 3-5 x daily - 3 reps - 15 sec hold - Seated Wrist Radial Deviation Stretch  - 3-5 x daily - 3 reps - 15 hold - Seated Composite Thumb Flexion PROM  - 4 x daily - 3-5 reps - 15 sec hold - Thumb Webspace Stretch  - 3 x daily - 3 reps - 15 sec hold - Full Fist  - 2-3 x daily - 5 reps - 10sec hold - Median Nerve Flossing  - 2-3 x daily - 5-10 reps - Hammer Stretch or Strength   - 2-4 x daily - 1-2 sets - 10-15 reps - Wrist Extension with Resistance  - 2-4 x daily - 1-2 sets - 10-15 reps - Wrist Flexion with Resistance  - 2-4 x daily - 1-2 sets - 10-15 reps    PATIENT EDUCATION: Education details: See tx section above for details  Person educated: Patient Education method: Verbal Instruction, Teach back, Handouts  Education comprehension: States and demonstrates understanding, Additional Education required    HOME EXERCISE PROGRAM: Access Code: [email or web address] URL: [email or web address] Date: 07/10/2023 Prepared by: Leartis Proud    GOALS: Goals reviewed with patient? Yes   SHORT TERM GOALS: (STG required if POC>30 days) Target Date: 07/26/2023  Pt will obtain protective, custom orthotic. Goal status: 07/10/2023: MET   2.  Pt will demo/state understanding of initial HEP to improve pain levels and prerequisite motion. Goal status: 07/18/23: MET   LONG TERM GOALS: Target Date: 08/23/2023  Pt will improve functional ability by decreased impairment per Quick DASH assessment from 50% to 20% or better, for better  quality of life. Goal status: INITIAL  2.  Pt will improve grip strength in right hand from unsafe to  test to at least 35 lbs for functional use at home and in IADLs. Goal status: INITIAL  3.  Pt will improve A/ROM in right wrist flexion/extension from 31/44 degrees respectively to at least 60 degrees each, to have functional motion for tasks like reach and grasp.  Goal status: INITIAL  4.  Pt will improve strength in left wrist flexion/extension from apparent 3 -/5 MMT to at least 4+/5 MMT to have increased functional ability to carry out selfcare and higher-level homecare tasks with less difficulty. Goal status: INITIAL  5.  Pt will improve coordination skills in right hand and arm, as seen by within functional limit score on nine-hole peg testing to have increased functional ability to carry out fine motor tasks (fasteners, etc.) and more complex, coordinated IADLs (meal prep, sports, etc.).  Goal status: INITIAL  6.  Pt will decrease pain at rest from 4/10 to 1/10 or better to have better sleep and occupational participation in daily roles. Goal status: INITIAL   ASSESSMENT:  CLINICAL IMPRESSION: 08/07/23: Despite another small exacerbation, she is doing very well, largely nontender, has fair to good motion, strength is greatly improving.   07/31/23: She had a slight exacerbation, but OT is confident that this is just a strain/sprain and she should take it easy for a few days to a week.  We discussed this and how to advance as tolerated.  Carry on  07/24/23: She is definitely improving as she tolerates thumb motion well, as well as light gripping now.  Will attempt weaning and light strengthening progressively now     PLAN:  OT FREQUENCY: 1-2x/week  OT DURATION: 6 weeks through 08/23/2023 and up to 10 total visits as needed  PLANNED INTERVENTIONS: 97168 OT Re-evaluation, 97535 self care/ADL training, 45409 therapeutic exercise, 97530 therapeutic activity, 97112 neuromuscular re-education, 97140 manual therapy, 97035 ultrasound, 97039 fluidotherapy, 97010 moist heat, 97010 cryotherapy,  97760 Orthotics management and training, 81191 Subsequent splinting/medication, passive range of motion, compression bandaging, Dry needling, energy conservation, coping strategies training, and patient/family education  CONSULTED AND AGREED WITH PLAN OF CARE: Patient  PLAN FOR NEXT SESSION:   Check on her after her doctor's visit next week, continue to upgrade strengthening and wean out of brace for functional activities as tolerated  Leartis Proud, OTR/L, CHT 08/07/2023, 10:16 AM

## 2023-08-07 ENCOUNTER — Encounter: Payer: Self-pay | Admitting: Rehabilitative and Restorative Service Providers"

## 2023-08-07 ENCOUNTER — Ambulatory Visit: Admitting: Rehabilitative and Restorative Service Providers"

## 2023-08-07 DIAGNOSIS — M25531 Pain in right wrist: Secondary | ICD-10-CM

## 2023-08-07 DIAGNOSIS — M25631 Stiffness of right wrist, not elsewhere classified: Secondary | ICD-10-CM | POA: Diagnosis not present

## 2023-08-07 DIAGNOSIS — M6281 Muscle weakness (generalized): Secondary | ICD-10-CM | POA: Diagnosis not present

## 2023-08-07 DIAGNOSIS — R278 Other lack of coordination: Secondary | ICD-10-CM

## 2023-08-07 DIAGNOSIS — R600 Localized edema: Secondary | ICD-10-CM

## 2023-08-12 ENCOUNTER — Other Ambulatory Visit (INDEPENDENT_AMBULATORY_CARE_PROVIDER_SITE_OTHER)

## 2023-08-12 ENCOUNTER — Ambulatory Visit: Admitting: Orthopedic Surgery

## 2023-08-12 DIAGNOSIS — S62024A Nondisplaced fracture of middle third of navicular [scaphoid] bone of right wrist, initial encounter for closed fracture: Secondary | ICD-10-CM

## 2023-08-12 DIAGNOSIS — M25531 Pain in right wrist: Secondary | ICD-10-CM

## 2023-08-12 NOTE — Progress Notes (Signed)
 Ashley Mathews - 63 y.o. female MRN [id number]  Date of birth: 02/21/1961  Office Visit Note: Visit Date: 08/12/2023 PCP: Imelda Man, MD Referred by: Imelda Man, MD  Subjective: No chief complaint on file.  HPI: Ashley Mathews is a pleasant 63 y.o. female who presents today for for follow up of a right wrist scaphoid fracture being treated nonoperatively.  She has been doing occupational therapy weekly as prescribed, is making strides with range of motion.  She states that she did have a recent trauma approximately 2 weeks ago in the grocery store, with impact to the ulnar aspect of the right wrist with ongoing pain in that region.  She has been weaning from the bracing as instructed by occupational therapy.  Pertinent ROS were reviewed with the patient and found to be negative unless otherwise specified above in HPI.     Assessment & Plan: Visit Diagnoses:  1. Closed nondisplaced fracture of middle third of scaphoid bone of right wrist, initial encounter   2. Pain in right wrist     Plan: Extensive discussion was had with the patient today regarding her right wrist scaphoid fracture.  Repeat x-rays today including dedicated scaphoid view were obtained which do show appropriate interval healing nondisplaced scaphoid fracture, this correlates with her clinical examination previously, her tenderness has improved significantly as well as her range of motion.  I would like her to continue with occupational therapy and progressively strengthen these wrist as tolerated.  She can utilize the brace as needed.  As for the ulnar-sided wrist pain on today's examination, there is some point tenderness over the TFCC foveal site.  There is no frank instability at the DRUJ on testing today.  We can incorporate this into her occupational therapy exercises for range of motion and strengthening.  Should she remain symptomatic, I did discuss with the possibility of further workup, possible  injection or potential bracing in the future dedicated to the ulnar aspect of the wrist.  She will follow-up in approximate 6 weeks.  Follow-up: No follow-ups on file.   Meds & Orders: No orders of the defined types were placed in this encounter.   Orders Placed This Encounter  Procedures   XR Wrist Complete Right     Procedures: No procedures performed      Clinical History: No specialty comments available.  She reports that she has never smoked. She has never used smokeless tobacco. No results for input(s): "HGBA1C", "LABURIC" in the last 8760 hours.  Objective:   Vital Signs: There were no vitals taken for this visit.  Physical Exam  Gen: Well-appearing, in no acute distress; non-toxic CV: Regular Rate. Well-perfused. Warm.  Resp: Breathing unlabored on room air; no wheezing. Psych: Fluid speech in conversation; appropriate affect; normal thought process  Ortho Exam Right wrist: - Minimal tenderness over the scaphoid tubercle and anatomic snuffbox region - Wrist range of motion well-preserved, flexion 70, extension 55 compared to contralateral side 80/65 - Full digital range of motion of the right hand, sensation is intact distally, hand remains warm well-perfused - Tenderness over the foveal site TFCC, DRUJ stable to stress testing neutral, pronation/supination  Imaging: XR Wrist Complete Right Result Date: 08/12/2023 X-rays of the right wrist show stable appearance of the previously known scaphoid fracture at the waist level, oblique view demonstrates fracture line at the volar cortex, nondisplaced in nature.  There does appear to be interval healing in comparison to the prior films.  There is  some lucency in the distal aspect of the scaphoid without evidence of bony collapse.      Past Medical/Family/Surgical/Social History: Medications & Allergies reviewed per EMR, new medications updated. Patient Active Problem List   Diagnosis Date Noted   Closed fracture of  scaphoid bone of right wrist 05/10/2023   Heel spur, right 02/18/2023   History of colonic polyps 02/07/2023   Irritable bowel syndrome with diarrhea 02/07/2023   Osteoporosis 06/01/2021   Hypersomnia 06/14/2017   Obstructive sleep apnea 06/14/2017   Knee pain, bilateral 12/22/2015   Palpitations 05/13/2013   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   Dyspnea 08/04/2012   Plantar fasciitis of right foot 02/20/2012   OTITIS MEDIA, CHRONIC 07/09/2008   GERD 07/09/2008   ALLERGIC RHINITIS 05/04/2008   ASTHMA 05/04/2008   LUPUS (SLE) 05/04/2008   FIBROMYALGIA 05/04/2008   HEADACHE, CHRONIC 05/04/2008   COUGH 05/04/2008   Past Medical History:  Diagnosis Date   Allergic rhinitis    Anxiety    occasionally   Asthma    as a child, only bothers her now after an URI   Chronic headache    Colon polyps    Complication of anesthesia    Patient states that sometimes she wakes up with uncontrollable crying after anesthesia. She also stated that  it took five attemps at a spinal for a c-section and that afterwards it took a long time to wear off.   Depression    Follows w/ PCP.   Fibromyalgia    Follows w/ PCP, Dr. Imelda Man.   Gallstones    Headache, chronic daily    improved   History of kidney stones    HLD (hyperlipidemia)    HPV in female    Hx of ASCUS   Hypothyroidism    Follows w/ PCP.   IBS (irritable bowel syndrome)    Knee effusion, left 03/2023   From fall.   Lupus    Patient states that her doctors think she may have non-specific autoimmunity instead of Lupus? Follows w/ PCP.   Palpitations    Plantar fasciitis of right foot    Sleep apnea    uses dental appliance, follows w/ neurology   SUI (stress urinary incontinence, female)    Wears glasses    Wrist fracture 03/2023   right   Family History  Problem Relation Age of Onset   Heart disease Mother    Hypertension Mother    Stroke Mother    Allergic rhinitis Mother    Scleroderma Mother    Colon polyps  Mother    Diabetes Mother    Irritable bowel syndrome Mother    Diverticulitis Mother    Sleep apnea Mother    Heart murmur Brother    Heart attack Maternal Grandfather    Lung cancer Maternal Aunt    Heart disease Maternal Aunt    Lung cancer Maternal Uncle    Heart disease Maternal Uncle    Heart disease Maternal Uncle    Heart disease Maternal Uncle    Diabetes Maternal Uncle    Liver disease Neg Hx    Colon cancer Neg Hx    Esophageal cancer Neg Hx    Past Surgical History:  Procedure Laterality Date   bladder prolapse repair     BLADDER SUSPENSION N/A 05/13/2023   Procedure: TRANSVAGINAL TAPE (TVT) PROCEDURE;  Surgeon: Arma Lamp, MD;  Location: [facility];  Service: Gynecology;  Laterality: N/A;   CERVICAL FUSION  11/2015   CESAREAN  SECTION  1987   CHOLECYSTECTOMY  2011   COLONOSCOPY  03/13/2023   CYSTOSCOPY N/A 05/13/2023   Procedure: CYSTOSCOPY;  Surgeon: Arma Lamp, MD;  Location: [facility];  Service: Gynecology;  Laterality: N/A;   LAPAROSCOPIC ENDOMETRIOSIS FULGURATION     MYRINGOTOMY     multiple   PARTIAL HYSTERECTOMY  1999   laparoscopic   Social History   Occupational History   Occupation: [employer]  Tobacco Use   Smoking status: Never   Smokeless tobacco: Never  Vaping Use   Vaping status: Never Used  Substance and Sexual Activity   Alcohol use: Yes    Comment: about 4 drinks of liquor per year   Drug use: No   Sexual activity: Yes    Nekhi Liwanag Alvia Jointer, M.D. [facility]

## 2023-08-13 NOTE — Therapy (Signed)
 OUTPATIENT OCCUPATIONAL THERAPY TREATMENT NOTE  Patient Name: Ashley Mathews MRN: [id number] DOB:1960/09/04, 63 y.o., female Today's Date: 08/14/2023  PCP: Lillette Reid MD REFERRING PROVIDER:  Merrill Abide, MD    END OF SESSION:  OT End of Session - 08/14/23 1191     Visit Number 6    Number of Visits 10    Date for OT Re-Evaluation 08/23/23    Authorization Type U[employer]    OT Start Time 0929    OT Stop Time 1008    OT Time Calculation (min) 39 min    Equipment Utilized During Treatment --    Activity Tolerance Patient tolerated treatment well;Patient limited by fatigue;No increased pain    Behavior During Therapy WFL for tasks assessed/performed                Past Medical History:  Diagnosis Date   Allergic rhinitis    Anxiety    occasionally   Asthma    as a child, only bothers her now after an URI   Chronic headache    Colon polyps    Complication of anesthesia    Patient states that sometimes she wakes up with uncontrollable crying after anesthesia. She also stated that  it took five attemps at a spinal for a c-section and that afterwards it took a long time to wear off.   Depression    Follows w/ PCP.   Fibromyalgia    Follows w/ PCP, Dr. Imelda Man.   Gallstones    Headache, chronic daily    improved   History of kidney stones    HLD (hyperlipidemia)    HPV in female    Hx of ASCUS   Hypothyroidism    Follows w/ PCP.   IBS (irritable bowel syndrome)    Knee effusion, left 03/2023   From fall.   Lupus    Patient states that her doctors think she may have non-specific autoimmunity instead of Lupus? Follows w/ PCP.   Palpitations    Plantar fasciitis of right foot    Sleep apnea    uses dental appliance, follows w/ neurology   SUI (stress urinary incontinence, female)    Wears glasses    Wrist fracture 03/2023   right   Past Surgical History:  Procedure Laterality Date   bladder prolapse repair     BLADDER SUSPENSION N/A 05/13/2023    Procedure: TRANSVAGINAL TAPE (TVT) PROCEDURE;  Surgeon: Arma Lamp, MD;  Location: [facility];  Service: Gynecology;  Laterality: N/A;   CERVICAL FUSION  11/2015   CESAREAN SECTION  1987   CHOLECYSTECTOMY  2011   COLONOSCOPY  03/13/2023   CYSTOSCOPY N/A 05/13/2023   Procedure: CYSTOSCOPY;  Surgeon: Arma Lamp, MD;  Location: [facility];  Service: Gynecology;  Laterality: N/A;   LAPAROSCOPIC ENDOMETRIOSIS FULGURATION     MYRINGOTOMY     multiple   PARTIAL HYSTERECTOMY  1999   laparoscopic   Patient Active Problem List   Diagnosis Date Noted   Closed fracture of scaphoid bone of right wrist 05/10/2023   Heel spur, right 02/18/2023   History of colonic polyps 02/07/2023   Irritable bowel syndrome with diarrhea 02/07/2023   Osteoporosis 06/01/2021   Hypersomnia 06/14/2017   Obstructive sleep apnea 06/14/2017   Knee pain, bilateral 12/22/2015   Palpitations 05/13/2013   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   Dyspnea 08/04/2012   Plantar fasciitis of right foot 02/20/2012   OTITIS MEDIA, CHRONIC 07/09/2008   GERD 07/09/2008  ALLERGIC RHINITIS 05/04/2008   ASTHMA 05/04/2008   LUPUS (SLE) 05/04/2008   FIBROMYALGIA 05/04/2008   HEADACHE, CHRONIC 05/04/2008   COUGH 05/04/2008    ONSET DATE: 04/08/23 DOI  REFERRING DIAG: M25.531 (ICD-10-CM) - Pain in right wrist   THERAPY DIAG:  Muscle weakness (generalized)  Stiffness of right wrist, not elsewhere classified  Other lack of coordination  Localized edema  Pain in right wrist  Rationale for Evaluation and Treatment: Rehabilitation  PERTINENT HISTORY: Approximately 3 months status post right scaphoid fracture with conservative healing, well-healing per physician and fine to start active range of motion but use caution with thumb motion  PRECAUTIONS: None  RED FLAGS: None   WEIGHT BEARING RESTRICTIONS: Yes no weightbearing more than 10 pounds or anything painful in the  right wrist and hand now.   SUBJECTIVE:   SUBJECTIVE STATEMENT: ~4 months s/p Rt scaphoid fx.  She states she has not gone down the stairs since her fall in December.  She is also having some pain when she is writing.     PAIN:  Are you having pain? Yes: NPRS scale: 2/10 at rest now Pain location: Right thumb and dorsal wrist Pain description: Aching Aggravating factors: Attempted weightbearing Relieving factors: Rest   PATIENT GOALS: To improve use of right dominant hand and arm for daily abilities  NEXT MD VISIT: 08/12/2023   OBJECTIVE: (All objective assessments below are from initial evaluation on: 07/10/23 unless otherwise specified.)   HAND DOMINANCE: Right   ADLs: Overall ADLs: States decreased ability to grab, hold household objects, pain and difficulty to open containers, perform FMS tasks (manipulate fasteners on clothing), mild to moderate bathing problems as well.    FUNCTIONAL OUTCOME MEASURES: Eval: Quick DASH 50% impairment today  (Higher % Score  =  More Impairment)     UPPER EXTREMITY ROM     Shoulder to Wrist AROM Right eval Rt 07/18/23 Rt 07/24/23 Rt 07/31/23 Rt 08/07/23 Rt 08/14/23  Forearm supination 70 76 76 77 75 76  Forearm pronation  90 90 90 90 90 90  Wrist flexion 31 45 54 56 54 69  Wrist extension 44 57 50 64 66 64  Wrist ulnar deviation   19 26    Wrist radial deviation   14 24    Functional dart thrower's motion (F-DTM) in ulnar flexion   53     F-DTM in radial extension    63     (Blank rows = not tested)   Hand AROM Right eval Rt 07/31/23  Full Fist Ability (or Gap to Distal Palmar Crease) full full  Thumb Opposition  (Kapandji Scale)  6/10 9/10  Thumb MCP (0-60)    Thumb IP (0-80)    Thumb Radial Abduction Span    Thumb Palmar Abduction Span    (Blank rows = not tested)   UPPER EXTREMITY MMT:      MMT Right 08/07/23 Rt 08/14/23  Elbow flexion    Elbow extension    Forearm supination 4-/5 tender, nerves 4+/5 no pain   Forearm pronation 4+/5 4+/5  Wrist flexion 4+/5 4+/5  Wrist extension 4+/5 4+/5  Wrist ulnar deviation 4+/5 4+/5  Wrist radial deviation 4+/5 4+/5  (Blank rows = not tested)  HAND FUNCTION: 08/14/23: 42#  07/31/23:  Rt: 25#   07/24/23: Grip: Rt : 22#,  Lt: 37#    COORDINATION: 08/14/23: 9HPT Rt: 19 sec WNL coordination    EDEMA:   08/14/23: none significant now   OBSERVATIONS:  07/31/23: OT gives her a comprehensive check today due to recent exacerbation.  Negative scaphoid shift test, slight swelling at the dorsum of the scaphoid, no significant tenderness or bruising, distal radial ulnar joint is somewhat unstable in supination but not pronation.  Thumb compression is not painful to scaphoid, scaphoid is somewhat tender in the snuffbox. OT feels that she just strained or sprained this area somewhat and we will compensate for this.   TODAY'S TREATMENT:    08/14/23: Today she performs active range of motion for exercise, Peg Test for coordination, grip strengthening activities, all showing improvement and better and better function.  To help with her thumb pain, OT does IASTM myofascial release around the thumb, after which she states writing is less painful.  Next, OT does functional mobility with her for functional activity training her own safety going up and down the stairs.  We practiced a step at a time forward and backward single-leg and double leg, and by the end, she is able to go up and down the stairs with no significant fear or apprehension.  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  She also is doing this for functional endurance.  She tolerates these well, states understanding HEP, and was told to prepare for possible discharge next week if all things are going well.    Green t-band rows x15  Green band tricep presses x15 Green band chest presses x15 Wrist flexion x10 3#  Wrist ext x 10  3#      PATIENT EDUCATION: Education details: See tx section above for details  Person educated: Patient Education method: Engineer, structural, Teach back, Handouts  Education comprehension: States and demonstrates understanding, Additional Education required    HOME EXERCISE PROGRAM: Access Code: [email or web address] URL: [email or web address] Date: 07/10/2023 Prepared by: Leartis Proud    GOALS: Goals reviewed with patient? Yes   SHORT TERM GOALS: (STG required if POC>30 days) Target Date: 07/26/2023  Pt will obtain protective, custom orthotic. Goal status: 07/10/2023: MET   2.  Pt will demo/state understanding of initial HEP to improve pain levels and prerequisite motion. Goal status: 07/18/23: MET   LONG TERM GOALS: Target Date: 08/23/2023  Pt will improve functional ability by decreased impairment per Quick DASH assessment from 50% to 20% or better, for better quality of life. Goal status: INITIAL  2.  Pt will improve grip strength in right hand from unsafe to test to at least 35 lbs for functional use at home and in IADLs. Goal status: INITIAL  3.  Pt will improve A/ROM in right wrist flexion/extension from 31/44 degrees respectively to at least 60 degrees each, to have functional motion for tasks like reach and grasp.  Goal status: INITIAL  4.  Pt will improve strength in left wrist flexion/extension from apparent 3 -/5 MMT to at least 4+/5 MMT to have increased functional ability to carry out selfcare and higher-level homecare tasks with less difficulty. Goal status: INITIAL  5.  Pt will improve coordination skills in right hand and arm, as seen by within functional limit score on nine-hole peg testing to have increased functional ability to carry out fine motor tasks (fasteners, etc.) and more complex, coordinated IADLs (meal prep, sports, etc.).  Goal status: INITIAL  6.  Pt will decrease pain at rest from 4/10 to 1/10 or better to have better sleep and  occupational participation in daily roles. Goal status: INITIAL   ASSESSMENT:  CLINICAL IMPRESSION: 08/14/23:  She is doing very well, but still fatigues easily especially with wrist flexion due to the nature of the FCR and the scaphoid.  Hopefully she will be feeling well enough to discharge to her own management next week.  08/07/23: Despite another small exacerbation, she is doing very well, largely nontender, has fair to good motion, strength is greatly improving.   07/31/23: She had a slight exacerbation, but OT is confident that this is just a strain/sprain and she should take it easy for a few days to a week.  We discussed this and how to advance as tolerated.  Carry on  07/24/23: She is definitely improving as she tolerates thumb motion well, as well as light gripping now.  Will attempt weaning and light strengthening progressively now     PLAN:  OT FREQUENCY: 1-2x/week  OT DURATION: 6 weeks through 08/23/2023 and up to 10 total visits as needed  PLANNED INTERVENTIONS: 97168 OT Re-evaluation, 97535 self care/ADL training, 95638 therapeutic exercise, 97530 therapeutic activity, 97112 neuromuscular re-education, 97140 manual therapy, 97035 ultrasound, 97039 fluidotherapy, 97010 moist heat, 97010 cryotherapy, 97760 Orthotics management and training, 75643 Subsequent splinting/medication, passive range of motion, compression bandaging, Dry needling, energy conservation, coping strategies training, and patient/family education  CONSULTED AND AGREED WITH PLAN OF CARE: Patient  PLAN FOR NEXT SESSION:   Perform a progress note and decide if any additional therapy is required.  Leartis Proud, OTR/L, CHT 08/14/2023, 10:13 AM

## 2023-08-14 ENCOUNTER — Encounter: Payer: Self-pay | Admitting: Rehabilitative and Restorative Service Providers"

## 2023-08-14 ENCOUNTER — Ambulatory Visit: Admitting: Rehabilitative and Restorative Service Providers"

## 2023-08-14 DIAGNOSIS — R278 Other lack of coordination: Secondary | ICD-10-CM

## 2023-08-14 DIAGNOSIS — M25631 Stiffness of right wrist, not elsewhere classified: Secondary | ICD-10-CM | POA: Diagnosis not present

## 2023-08-14 DIAGNOSIS — R600 Localized edema: Secondary | ICD-10-CM

## 2023-08-14 DIAGNOSIS — M25531 Pain in right wrist: Secondary | ICD-10-CM

## 2023-08-14 DIAGNOSIS — M6281 Muscle weakness (generalized): Secondary | ICD-10-CM

## 2023-08-20 NOTE — Therapy (Signed)
 OUTPATIENT OCCUPATIONAL THERAPY TREATMENT & discharge NOTE  Patient Name: Ashley Mathews MRN: [id number] DOB:11-Aug-1960, 63 y.o., female Today's Date: 08/21/2023  PCP: Lillette Reid MD REFERRING PROVIDER:  Merrill Abide, MD                OCCUPATIONAL THERAPY DISCHARGE SUMMARY  Visits from Start of Care: 7  Current functional level related to goals / functional outcomes: 08/21/23: She has now met all long-term goals, has no significant pain now or in the past week, and has been advancing her strength.  She has a compressive wrist wrap to help support her, and she was told to "take it easy" for the next month while she builds up her arms slowly and carefully.  She is happy with her progress.   Education / Equipment: Pt has all needed materials and education. Pt understands how to continue on with self-management. See tx notes for more details.   Patient agrees to discharge due to max benefits received from outpatient occupational therapy / hand therapy at this time.   Leartis Proud, OTR/L, CHT 08/21/23          END OF SESSION:  OT End of Session - 08/21/23 0932     Visit Number 7    Number of Visits 10    Date for OT Re-Evaluation 08/23/23    Authorization Type UHC    OT Start Time 0932    OT Stop Time 1018    OT Time Calculation (min) 46 min    Activity Tolerance Patient tolerated treatment well;Patient limited by fatigue;No increased pain    Behavior During Therapy WFL for tasks assessed/performed             Past Medical History:  Diagnosis Date   Allergic rhinitis    Anxiety    occasionally   Asthma    as a child, only bothers her now after an URI   Chronic headache    Colon polyps    Complication of anesthesia    Patient states that sometimes she wakes up with uncontrollable crying after anesthesia. She also stated that  it took five attemps at a spinal for a c-section and that afterwards it took a long time to wear off.   Depression     Follows w/ PCP.   Fibromyalgia    Follows w/ PCP, Dr. Imelda Man.   Gallstones    Headache, chronic daily    improved   History of kidney stones    HLD (hyperlipidemia)    HPV in female    Hx of ASCUS   Hypothyroidism    Follows w/ PCP.   IBS (irritable bowel syndrome)    Knee effusion, left 03/2023   From fall.   Lupus    Patient states that her doctors think she may have non-specific autoimmunity instead of Lupus? Follows w/ PCP.   Palpitations    Plantar fasciitis of right foot    Sleep apnea    uses dental appliance, follows w/ neurology   SUI (stress urinary incontinence, female)    Wears glasses    Wrist fracture 03/2023   right   Past Surgical History:  Procedure Laterality Date   bladder prolapse repair     BLADDER SUSPENSION N/A 05/13/2023   Procedure: TRANSVAGINAL TAPE (TVT) PROCEDURE;  Surgeon: Arma Lamp, MD;  Location: [facility];  Service: Gynecology;  Laterality: N/A;   CERVICAL FUSION  11/2015   CESAREAN SECTION  1987   CHOLECYSTECTOMY  2011  COLONOSCOPY  03/13/2023   CYSTOSCOPY N/A 05/13/2023   Procedure: CYSTOSCOPY;  Surgeon: Arma Lamp, MD;  Location: [facility];  Service: Gynecology;  Laterality: N/A;   LAPAROSCOPIC ENDOMETRIOSIS FULGURATION     MYRINGOTOMY     multiple   PARTIAL HYSTERECTOMY  1999   laparoscopic   Patient Active Problem List   Diagnosis Date Noted   Closed fracture of scaphoid bone of right wrist 05/10/2023   Heel spur, right 02/18/2023   History of colonic polyps 02/07/2023   Irritable bowel syndrome with diarrhea 02/07/2023   Osteoporosis 06/01/2021   Hypersomnia 06/14/2017   Obstructive sleep apnea 06/14/2017   Knee pain, bilateral 12/22/2015   Palpitations 05/13/2013   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   Dyspnea 08/04/2012   Plantar fasciitis of right foot 02/20/2012   OTITIS MEDIA, CHRONIC 07/09/2008   GERD 07/09/2008   ALLERGIC RHINITIS 05/04/2008   ASTHMA  05/04/2008   LUPUS (SLE) 05/04/2008   FIBROMYALGIA 05/04/2008   HEADACHE, CHRONIC 05/04/2008   COUGH 05/04/2008    ONSET DATE: 04/08/23 DOI  REFERRING DIAG: M25.531 (ICD-10-CM) - Pain in right wrist   THERAPY DIAG:  Muscle weakness (generalized)  Stiffness of right wrist, not elsewhere classified  Other lack of coordination  Localized edema  Pain in right wrist  Rationale for Evaluation and Treatment: Rehabilitation  PERTINENT HISTORY: Approximately 3 months status post right scaphoid fracture with conservative healing, well-healing per physician and fine to start active range of motion but use caution with thumb motion  PRECAUTIONS: None  RED FLAGS: None   WEIGHT BEARING RESTRICTIONS: Yes no weightbearing more than 10 pounds or anything painful in the right wrist and hand now.   SUBJECTIVE:   SUBJECTIVE STATEMENT: ~4 months s/p Rt scaphoid fx.  She states doing well, no pain right now.      PAIN:  Are you having pain? Yes: NPRS scale: 0/10 at rest now Pain location: Right thumb and dorsal wrist Pain description: Aching Aggravating factors: Attempted weightbearing Relieving factors: Rest   PATIENT GOALS: To improve use of right dominant hand and arm for daily abilities  NEXT MD VISIT: 08/12/2023   OBJECTIVE: (All objective assessments below are from initial evaluation on: 07/10/23 unless otherwise specified.)   HAND DOMINANCE: Right   ADLs: Overall ADLs: States decreased ability to grab, hold household objects, pain and difficulty to open containers, perform FMS tasks (manipulate fasteners on clothing), mild to moderate bathing problems as well.    FUNCTIONAL OUTCOME MEASURES: 08/21/23: Quick DASH: 18% today    Eval: Quick DASH 50% impairment today  (Higher % Score  =  More Impairment)     UPPER EXTREMITY ROM     Shoulder to Wrist AROM Right eval Rt 07/18/23 Rt 07/24/23 Rt 08/21/23  Forearm supination 70 76 76 75  Forearm pronation  90 90 90 90   Wrist flexion 31 45 54 62  Wrist extension 44 57 50 76  Wrist ulnar deviation   19 31  Wrist radial deviation   14 35  Functional dart thrower's motion (F-DTM) in ulnar flexion   53   F-DTM in radial extension    63   (Blank rows = not tested)   Hand AROM Right eval Rt 07/31/23  Full Fist Ability (or Gap to Distal Palmar Crease) full full  Thumb Opposition  (Kapandji Scale)  6/10 9/10  Thumb MCP (0-60)    Thumb IP (0-80)    Thumb Radial Abduction Span    Thumb Palmar Abduction  Span    (Blank rows = not tested)   UPPER EXTREMITY MMT:      MMT Right 08/07/23 Rt 08/14/23 Rt 08/21/23  Elbow flexion     Elbow extension     Forearm supination 4-/5 tender, nerves 4+/5 no pain 5/5  Forearm pronation 4+/5 4+/5 5/5  Wrist flexion 4+/5 4+/5 5/5  Wrist extension 4+/5 4+/5 5/5  Wrist ulnar deviation 4+/5 4+/5 5/5  Wrist radial deviation 4+/5 4+/5 5/5  (Blank rows = not tested)  HAND FUNCTION: 08/14/23: 42#  07/31/23:  Rt: 25#   07/24/23: Grip: Rt : 22#,  Lt: 37#    COORDINATION: 08/14/23: 9HPT Rt: 19 sec WNL coordination    EDEMA:   08/14/23: none significant now   OBSERVATIONS:   07/31/23: OT gives her a comprehensive check today due to recent exacerbation.  Negative scaphoid shift test, slight swelling at the dorsum of the scaphoid, no significant tenderness or bruising, distal radial ulnar joint is somewhat unstable in supination but not pronation.  Thumb compression is not painful to scaphoid, scaphoid is somewhat tender in the snuffbox. OT feels that she just strained or sprained this area somewhat and we will compensate for this.   TODAY'S TREATMENT:    08/21/23: Pt performs AROM, gripping, and strength with Rt wrist/hand/arm against therapist's resistance for exercise/activities as well as new measures today. OT also discusses home and functional tasks with the pt and reviews goals. Using the complied data, OT also reviews home exercises and provides final recommendations  and upgrades as below, and she performs advanced progressive resistive exercises in this session showing greater tolerance to resistance and better functional performance. Pt states understanding and tolerates upgrades well, agrees to cautiously discharge-using caution for the next month, and carefully building up the strength in her right wrist and arm as prescribed.   Functional exercises performed today: 20# bil rows x 15 10# chest press x 10 20# tricep presses  10# bicep curls  4# wrist ext, 3# wrist flex x10 each     Exercises reviewed today which should continue at least every other day 2-3 times a day for the next month: - Wrist Flexion Stretch  - 3 x daily - 5 reps - 15 sec hold - Wrist Prayer Stretch  - 1-3 x daily - 3 reps - 15 sec hold - Seated Wrist Radial Deviation Stretch  - 1-3 x daily - 3 reps - 15 hold - Seated Composite Thumb Flexion PROM  - 1-3 x daily - 3 reps - 15 sec hold - Thumb Webspace Stretch  - 1-3 x daily - 3 reps - 15 sec hold - Full Fist  - 1-2 x daily - 3-5 x weekly - 5 reps - 10sec hold - Median Nerve Flossing  - 2-3 x daily - 5-10 reps - Standing Shoulder Row with Anchored Resistance  - 1-2 x daily - 3-5 x weekly - 1-2 sets - 10-15 reps - Standing Bicep Curls with Resistance  - 2-4 x daily - 1-2 sets - 10-15 reps - CLX Tricep Pressdowns  - 4-6 x daily - 1 sets - 10-15 reps - Hammer Stretch or Strength   - 2-4 x daily - 1-2 sets - 10-15 reps - Wrist Extension with Resistance  - 2-4 x daily - 1-2 sets - 10-15 reps - Wrist Flexion with Resistance  - 2-4 x daily - 1-2 sets - 10-15 reps     PATIENT EDUCATION: Education details: See tx section above for details  Person educated: Patient Education method: Engineer, structural, Teach back, Handouts  Education comprehension: States and demonstrates understanding   HOME EXERCISE PROGRAM: Access Code: [email or web address] URL: [email or web address] Date: 07/10/2023 Prepared by: Leartis Proud    GOALS: Goals reviewed with patient? Yes   SHORT TERM GOALS: (STG required if POC>30 days) Target Date: 07/26/2023  Pt will obtain protective, custom orthotic. Goal status: 07/10/2023: MET   2.  Pt will demo/state understanding of initial HEP to improve pain levels and prerequisite motion. Goal status: 07/18/23: MET   LONG TERM GOALS: Target Date: 08/23/2023  Pt will improve functional ability by decreased impairment per Quick DASH assessment from 50% to 20% or better, for better quality of life. Goal status: 08/21/23: MET  2.  Pt will improve grip strength in right hand from unsafe to test to at least 35 lbs for functional use at home and in IADLs. Goal status: 08/21/23: MET  3.  Pt will improve A/ROM in right wrist flexion/extension from 31/44 degrees respectively to at least 60 degrees each, to have functional motion for tasks like reach and grasp.  Goal status: 08/21/23: MET  4.  Pt will improve strength in left wrist flexion/extension from apparent 3 -/5 MMT to at least 4+/5 MMT to have increased functional ability to carry out selfcare and higher-level homecare tasks with less difficulty. Goal status: 08/21/23: MET  5.  Pt will improve coordination skills in right hand and arm, as seen by within functional limit score on nine-hole peg testing to have increased functional ability to carry out fine motor tasks (fasteners, etc.) and more complex, coordinated IADLs (meal prep, sports, etc.).  Goal status: 08/21/23: MET  6.  Pt will decrease pain at rest from 4/10 to 1/10 or better to have better sleep and occupational participation in daily roles. Goal status: 08/21/23: MET   ASSESSMENT:  CLINICAL IMPRESSION: 08/21/23: She has now met all long-term goals, has no significant pain now or in the past week, and has been advancing her strength.  She has a compressive wrist wrap to help support her, and she was told to "take it easy" for the next month while she builds up her arms slowly  and carefully.  She is happy with her progress.     PLAN:  OT FREQUENCY: Discharge  OT DURATION: Discharge  PLANNED INTERVENTIONS: 86578 OT Re-evaluation, 97535 self care/ADL training, 46962 therapeutic exercise, 97530 therapeutic activity, 97112 neuromuscular re-education, 97140 manual therapy, 97035 ultrasound, 97039 fluidotherapy, 97010 moist heat, 97010 cryotherapy, 97760 Orthotics management and training, 95284 Subsequent splinting/medication, passive range of motion, compression bandaging, Dry needling, energy conservation, coping strategies training, and patient/family education  CONSULTED AND AGREED WITH PLAN OF CARE: Patient  PLAN FOR NEXT SESSION:   Discharge  Leartis Proud, OTR/L, CHT 08/21/2023, 1:41 PM

## 2023-08-21 ENCOUNTER — Encounter: Payer: Self-pay | Admitting: Rehabilitative and Restorative Service Providers"

## 2023-08-21 ENCOUNTER — Ambulatory Visit: Admitting: Rehabilitative and Restorative Service Providers"

## 2023-08-21 DIAGNOSIS — R278 Other lack of coordination: Secondary | ICD-10-CM

## 2023-08-21 DIAGNOSIS — M25531 Pain in right wrist: Secondary | ICD-10-CM

## 2023-08-21 DIAGNOSIS — R600 Localized edema: Secondary | ICD-10-CM

## 2023-08-21 DIAGNOSIS — M25631 Stiffness of right wrist, not elsewhere classified: Secondary | ICD-10-CM

## 2023-08-21 DIAGNOSIS — M6281 Muscle weakness (generalized): Secondary | ICD-10-CM | POA: Diagnosis not present

## 2023-08-28 ENCOUNTER — Encounter: Admitting: Rehabilitative and Restorative Service Providers"

## 2023-09-23 ENCOUNTER — Ambulatory Visit: Admitting: Orthopedic Surgery

## 2023-09-23 ENCOUNTER — Other Ambulatory Visit (INDEPENDENT_AMBULATORY_CARE_PROVIDER_SITE_OTHER)

## 2023-09-23 DIAGNOSIS — S62024A Nondisplaced fracture of middle third of navicular [scaphoid] bone of right wrist, initial encounter for closed fracture: Secondary | ICD-10-CM | POA: Diagnosis not present

## 2023-09-23 DIAGNOSIS — M25531 Pain in right wrist: Secondary | ICD-10-CM

## 2023-09-23 NOTE — Progress Notes (Signed)
 Ashley Mathews - 63 y.o. female MRN [id number]  Date of birth: 05-Jun-1960  Office Visit Note: Visit Date: 09/23/2023 PCP: Imelda Man, MD Referred by: Imelda Man, MD  Subjective: No chief complaint on file.  HPI: Ashley Mathews is a pleasant 63 y.o. female who presents today for for follow up of a right wrist scaphoid fracture being treated nonoperatively.  She has been discharged from occupational therapy, has met all of her goals..    Today, she describes some soreness along the radial aspect of the wrist as well as the ulnar aspect of the wrist.  Pertinent ROS were reviewed with the patient and found to be negative unless otherwise specified above in HPI.    Assessment & Plan: Visit Diagnoses:  1. Pain in right wrist   2. Closed nondisplaced fracture of middle third of scaphoid bone of right wrist, initial encounter     Plan: Extensive discussion was had with the patient today regarding her right wrist.  She has healed the nondisplaced scaphoid fracture well based on clinical and radiographic evaluation.  As for the ulnar-sided wrist pain on today's examination, there is some point tenderness over the TFCC foveal site.  There is no frank instability at the DRUJ on testing today.  We can incorporate this into her occupational therapy exercises for range of motion and strengthening.  Should she remain symptomatic, I did discuss with the possibility of further workup, possible injection or potential bracing in the future dedicated to the ulnar aspect of the wrist.    She is also demonstrating some signs and symptoms consistent with de Quervain's tenosynovitis based on clinical examination today.  Explained to her the other etiology and pathophysiology of this condition as well as potential treat modalities range from conservative to surgical.  Given that her symptoms are quite mild at this time, she would like to continue with conservative treatment in the form of activity  modification and bracing as needed.  She also does have a thumb spica brace that can be utilized for activities moving forward if needed.  I will plan on seeing her back on an as-needed basis should the symptoms persist on either the radial or ulnar aspect of the wrist for further discussion of potential treatment modalities.  I spent 30 minutes in the care of this patient today including review of previous documentation, imaging obtained, face-to-face time discussing all options regarding treatment and documenting the encounter.   Follow-up: No follow-ups on file.   Meds & Orders: No orders of the defined types were placed in this encounter.   Orders Placed This Encounter  Procedures   XR Wrist Complete Right     Procedures: No procedures performed      Clinical History: No specialty comments available.  She reports that she has never smoked. She has never used smokeless tobacco. No results for input(s): "HGBA1C", "LABURIC" in the last 8760 hours.  Objective:   Vital Signs: There were no vitals taken for this visit.  Physical Exam  Gen: Well-appearing, in no acute distress; non-toxic CV: Regular Rate. Well-perfused. Warm.  Resp: Breathing unlabored on room air; no wheezing. Psych: Fluid speech in conversation; appropriate affect; normal thought process  Ortho Exam Right wrist: - Minimal tenderness over the scaphoid tubercle and anatomic snuffbox region - Wrist range of motion well-preserved, flexion 70, extension 55 compared to contralateral side 80/65 - Full digital range of motion of the right hand, sensation is intact distally, hand remains warm  well-perfused - Tenderness over the foveal site TFCC, DRUJ stable to stress testing neutral, pronation/supination 80/80 - Positive Finkelstein's  Imaging: XR Wrist Complete Right Result Date: 09/23/2023 X-rays of the right wrist show stable appearance of the previously known scaphoid fracture at the waist level with what appears  to be interval healing.  There is some lucency in the distal aspect of the scaphoid without evidence of bony collapse.     Past Medical/Family/Surgical/Social History: Medications & Allergies reviewed per EMR, new medications updated. 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 She also stated that  it took five attemps at a spinal for a c-section and that afterwards it took a long time to wear off.   Depression    Follows w/ PCP.   Fibromyalgia    Follows w/ PCP, Dr. Imelda Man.   Gallstones    Headache, chronic daily    improved   History of kidney stones    HLD (hyperlipidemia)    HPV in female    Hx of ASCUS   Hypothyroidism    Follows w/ PCP.   IBS (irritable bowel syndrome)    Knee effusion, left 03/2023   From fall.   Lupus    Patient states that her doctors think she may have non-specific autoimmunity instead of Lupus? Follows w/ PCP.   Palpitations    Plantar fasciitis of right foot    Sleep apnea    uses dental  appliance, follows w/ neurology   SUI (stress urinary incontinence, female)    Wears glasses    Wrist fracture 03/2023   right   Family History  Problem Relation Age of Onset   Heart disease Mother    Hypertension Mother    Stroke Mother    Allergic rhinitis Mother    Scleroderma Mother    Colon polyps Mother    Diabetes Mother    Irritable bowel syndrome Mother    Diverticulitis Mother    Sleep apnea Mother    Heart murmur Brother    Heart attack Maternal Grandfather    Lung cancer Maternal Aunt    Heart disease Maternal Aunt    Lung cancer Maternal Uncle    Heart disease Maternal Uncle    Heart disease Maternal Uncle    Heart disease Maternal Uncle    Diabetes Maternal Uncle    Liver disease Neg Hx    Colon cancer Neg Hx    Esophageal cancer Neg Hx  Past Surgical History:  Procedure Laterality Date   bladder prolapse repair     BLADDER SUSPENSION N/A 05/13/2023   Procedure: TRANSVAGINAL TAPE (TVT) PROCEDURE;  Surgeon: Arma Lamp, MD;  Location: [facility];  Service: Gynecology;  Laterality: N/A;   CERVICAL FUSION  11/2015   CESAREAN SECTION  1987   CHOLECYSTECTOMY  2011   COLONOSCOPY  03/13/2023   CYSTOSCOPY N/A 05/13/2023   Procedure: CYSTOSCOPY;  Surgeon: Arma Lamp, MD;  Location: [facility];  Service: Gynecology;  Laterality: N/A;   LAPAROSCOPIC ENDOMETRIOSIS FULGURATION     MYRINGOTOMY     multiple   PARTIAL HYSTERECTOMY  1999   laparoscopic   Social History   Occupational History   Occupation: [employer]  Tobacco Use   Smoking status: Never   Smokeless tobacco: Never  Vaping Use   Vaping status: Never Used  Substance and Sexual Activity   Alcohol use: Yes    Comment: about 4 drinks of liquor per year   Drug use: No   Sexual activity: Yes    Ricky Doan Alvia Jointer, M.D. [facility]

## 2024-02-17 ENCOUNTER — Encounter: Payer: Self-pay | Admitting: Radiology
